# Patient Record
Sex: Male | Born: 1980 | Race: White | Hispanic: No | Marital: Married | Smoking: Never smoker
Health system: Southern US, Community
[De-identification: ages and names within clinical notes are randomized; demographics above are authoritative.]

## PROBLEM LIST (undated history)

## (undated) DIAGNOSIS — E538 Deficiency of other specified B group vitamins: Secondary | ICD-10-CM

## (undated) DIAGNOSIS — E559 Vitamin D deficiency, unspecified: Secondary | ICD-10-CM

## (undated) DIAGNOSIS — I10 Essential (primary) hypertension: Secondary | ICD-10-CM

## (undated) DIAGNOSIS — F419 Anxiety disorder, unspecified: Secondary | ICD-10-CM

## (undated) DIAGNOSIS — R7303 Prediabetes: Secondary | ICD-10-CM

## (undated) DIAGNOSIS — E785 Hyperlipidemia, unspecified: Secondary | ICD-10-CM

## (undated) HISTORY — DX: Vitamin D deficiency, unspecified: E55.9

## (undated) HISTORY — DX: Essential (primary) hypertension: I10

## (undated) HISTORY — DX: Prediabetes: R73.03

## (undated) HISTORY — DX: Hyperlipidemia, unspecified: E78.5

## (undated) HISTORY — DX: Anxiety disorder, unspecified: F41.9

## (undated) HISTORY — DX: Deficiency of other specified B group vitamins: E53.8

---

## 2014-02-02 ENCOUNTER — Encounter: Payer: Self-pay | Admitting: Physician Assistant

## 2014-02-02 ENCOUNTER — Ambulatory Visit (INDEPENDENT_AMBULATORY_CARE_PROVIDER_SITE_OTHER): Payer: BC Managed Care – PPO | Admitting: Physician Assistant

## 2014-02-02 VITALS — BP 168/98 | HR 100 | Temp 98.3°F | Resp 16 | Ht 72.0 in | Wt 256.0 lb

## 2014-02-02 DIAGNOSIS — F411 Generalized anxiety disorder: Secondary | ICD-10-CM

## 2014-02-02 DIAGNOSIS — E669 Obesity, unspecified: Secondary | ICD-10-CM | POA: Insufficient documentation

## 2014-02-02 DIAGNOSIS — E559 Vitamin D deficiency, unspecified: Secondary | ICD-10-CM

## 2014-02-02 DIAGNOSIS — Z0000 Encounter for general adult medical examination without abnormal findings: Secondary | ICD-10-CM

## 2014-02-02 DIAGNOSIS — Z79899 Other long term (current) drug therapy: Secondary | ICD-10-CM

## 2014-02-02 DIAGNOSIS — I10 Essential (primary) hypertension: Secondary | ICD-10-CM

## 2014-02-02 LAB — CBC WITH DIFFERENTIAL/PLATELET
Basophils Absolute: 0 10*3/uL (ref 0.0–0.1)
Basophils Relative: 0 % (ref 0–1)
EOS PCT: 0 % (ref 0–5)
Eosinophils Absolute: 0 10*3/uL (ref 0.0–0.7)
HCT: 45.3 % (ref 39.0–52.0)
HEMOGLOBIN: 15.8 g/dL (ref 13.0–17.0)
LYMPHS ABS: 1.4 10*3/uL (ref 0.7–4.0)
LYMPHS PCT: 26 % (ref 12–46)
MCH: 30.1 pg (ref 26.0–34.0)
MCHC: 34.9 g/dL (ref 30.0–36.0)
MCV: 86.3 fL (ref 78.0–100.0)
MONO ABS: 0.5 10*3/uL (ref 0.1–1.0)
Monocytes Relative: 10 % (ref 3–12)
Neutro Abs: 3.4 10*3/uL (ref 1.7–7.7)
Neutrophils Relative %: 64 % (ref 43–77)
PLATELETS: 248 10*3/uL (ref 150–400)
RBC: 5.25 MIL/uL (ref 4.22–5.81)
RDW: 13.5 % (ref 11.5–15.5)
WBC: 5.3 10*3/uL (ref 4.0–10.5)

## 2014-02-02 MED ORDER — ATENOLOL 100 MG PO TABS: ORAL_TABLET | ORAL | Status: DC

## 2014-02-02 NOTE — Progress Notes (Signed)
Complete Physical HPI 33 y.o. male  presents for a complete physical as a new patient.  His blood pressure has not been controlled at home, today their BP is BP: 168/98 mmHg Wife is here and states that past 6 months he has not been feeling well. She states he has come in flush, has family history of BP. The other day he had a splitting headache, took four 200mg  ibuprofen, started to work out and then felt dizzy nausea and had to stop. He laid down in the gym with his feet up for 15-20 mins, took BP at [facility] after feeling better with 200's/110. That same night had a nose bleed in the shower and heart burn in the morning.  His wife has been taking his BP it has been 150's/100's.  He eats very well, works out daily but has gained weight. He feels he gets tired after eating.  He doesn't sleep well, "runs in his sleep per wife", wakes her up. He is constantly worrying and has to do something.  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  He is able to workout for past month without CP, SOB, dizziness, Edema.  Current Medications:  No current outpatient prescriptions on file prior to visit.   No current facility-administered medications on file prior to visit.   Health Maintenance:   There is no immunization history on file for this patient.  Tetanus: 5 years ago Pneumovax: N/A Flu vaccine: N/A Zostavax: N/A DEXA: N/A Colonoscopy: N/A EGD: N/A  Allergies: No Known Allergies Medical History: History reviewed. No pertinent past medical history. Surgical History: History reviewed. No pertinent past surgical history. Family History:  Family History  Problem Relation Age of Onset  . Hypertension Father    Social History:   History  Substance Use Topics  . Smoking status: Never Smoker   . Smokeless tobacco: Never Used  . Alcohol Use: Not on file     Comment: social   ROS:  [X]  = complains of  [ ]  = denies  General: Fatigue  [ X] Fever [ ]  Chills [ ]  Weakness [ ]   Insomnia [ ]  Weight change Arly.Keller ] Night sweats [ ]   Change in appetite [ ]  Eyes: DEE 1-2 months ago Redness [ ]  Blurred vision [ ]  Diplopia [ ]  Discharge [ ]   ENT: Congestion [ ]  Sinus Pain [ ]  Post Nasal Drip [ ]  Sore Throat [ ]  Earache [ ]  hearing loss [ ]  Tinnitus [ ]  Snoring [ ]   Cardiac: Chest pain/pressure Arly.Keller ] SOB [ ]  Orthopnea [ ]   Palpitations [ ]   Paroxysmal nocturnal dyspnea[ ]  Claudication [ ]  Edema [ ]   Pulmonary: Cough [ ]  Wheezing[ ]   SOB [ ]   Pleurisy [ ]   GI: Nausea [ ]  Vomiting[ ]  Dysphagia[ ]  Heartburn[X ] Abdominal pain [ ]  Constipation [ ] ; Diarrhea [ ]  BRBPR [ ]  Melena[ ]  Bloating [ ]  Hemorrhoids [ ]   GU: Hematuria[ ]  Dysuria [ ]  Nocturia[ ]  Urgency [ ]   Hesitancy [ ]  Discharge [ ]  Frequency [ ]   Neuro: Headaches[ X] Vertigo[ ]  Paresthesias[ ]  Spasm [ ]  Speech changes [ ]  Incoordination [ ]   Ortho: Arthritis [ ]  Joint pain [ ]  Muscle pain [ ]  Joint swelling [ ]  Back Pain [ ]  Skin:  Rash [ ]   Pruritis [ ]  Change in skin lesion [ ]   Psych: Depression[ ]  Anxiety[X ] Confusion [ ]  Memory loss [ ]   Heme/Lypmh: Bleeding [ ]  Bruising [ ]  Enlarged lymph nodes [ ]   Endocrine: Visual blurring [ ]  Paresthesia [ ]  Polyuria [ ]  Polydypsea [ ]    Heat/cold intolerance [ ]  Hypoglycemia [ ]   Physical Exam: Estimated body mass index is 34.71 kg/(m^2) as calculated from the following:   Height as of this encounter: 6' (1.829 m).   Weight as of this encounter: 256 lb (116.121 kg). BP 168/98  Pulse 100  Temp(Src) 98.3 F (36.8 C)  Resp 16  Ht 6' (1.829 m)  Wt 256 lb (116.121 kg)  BMI 34.71 kg/m2 General Appearance: Well nourished, in no apparent distress. Eyes: PERRLA, EOMs, conjunctiva no swelling or erythema, normal fundi and vessels. Sinuses: No Frontal/maxillary tenderness ENT/Mouth: Ext aud canals clear, normal light reflex with TMs without erythema, bulging. Good dentition. No erythema, swelling, or exudate on post pharynx. Tonsils not  swollen or erythematous. Hearing normal.  Neck: Supple, thyroid normal. No bruits Respiratory: Respiratory effort normal, BS equal bilaterally without rales, rhonchi, wheezing or stridor. Cardio: RRR without murmurs, rubs or gallops. Brisk peripheral pulses without edema.  Chest: symmetric, with normal excursions and percussion. Abdomen: Soft, flat, +BS. Non tender, no guarding, rebound, hernias, masses, or organomegaly. .  Lymphatics: Non tender without lymphadenopathy.  Genitourinary: DEFER Musculoskeletal: Full ROM all peripheral extremities,5/5 strength, and normal gait. Skin: Warm, dry without rashes, lesions, ecchymosis.  Neuro: Cranial nerves intact, reflexes equal bilaterally. Normal muscle tone, no cerebellar symptoms. Sensation intact.  Psych: Awake and oriented X 3, normal affect, Insight and Judgment appropriate.   EKG: WNL, no ST changes  Assessment and Plan: Fatigue-  Check labs HTN- get CXR, EKG, mircoalbumin, evaluate for end organ damage, Atenolol 100mg  start 1/2 pill daily and can go up to one, check labs Anxiety- add BB, will reevaluate in 1 week Health Maintenance   Discussed med's effects and SE's. Screening labs and tests as requested with regular follow-up as recommended.  Quentin Mullingollier, Tiawana Forgy 3:52 PM

## 2014-02-02 NOTE — Patient Instructions (Signed)
Generalized Anxiety Disorder Generalized anxiety disorder (GAD) is a mental disorder. It interferes with life functions, including relationships, work, and school. GAD is different from normal anxiety, which everyone experiences at some point in their lives in response to specific life events and activities. Normal anxiety actually helps [place] prepare for and get through these life events and activities. Normal anxiety goes away after the event or activity is over.  GAD causes anxiety that is not necessarily related to specific events or activities. It also causes excess anxiety in proportion to specific events or activities. The anxiety associated with GAD is also difficult to control. GAD can vary from mild to severe. People with severe GAD can have intense waves of anxiety with physical symptoms (panic attacks).  SYMPTOMS The anxiety and worry associated with GAD are difficult to control. This anxiety and worry are related to many life events and activities and also occur more days than not for 6 months or longer. People with GAD also have three or more of the following symptoms (one or more in children):  Restlessness.   Fatigue.  Difficulty concentrating.   Irritability.  Muscle tension.  Difficulty sleeping or unsatisfying sleep. DIAGNOSIS GAD is diagnosed through an assessment by your caregiver. Your caregiver will ask you questions aboutyour mood,physical symptoms, and events in your life. Your caregiver may ask you about your medical history and use of alcohol or drugs, including prescription medications. Your caregiver may also do a physical exam and blood tests. Certain medical conditions and the use of certain substances can cause symptoms similar to those associated with GAD. Your caregiver may refer you to a mental health specialist for further evaluation. TREATMENT The following therapies are usually used to treat GAD:   Medication Antidepressant medication usually is  prescribed for long-term daily control. Antianxiety medications may be added in severe cases, especially when panic attacks occur.   Talk therapy (psychotherapy) Certain types of talk therapy can be helpful in treating GAD by providing support, education, and guidance. A form of talk therapy called cognitive behavioral therapy can teach you healthy ways to think about and react to daily life events and activities.  Stress managementtechniques These include yoga, meditation, and exercise and can be very helpful when they are practiced regularly. A mental health specialist can help determine which treatment is best for you. Some people see improvement with one therapy. However, other people require a combination of therapies. Document Released: 03/07/2013 Document Reviewed: 03/07/2013 [facility] Patient Information 2014 [place], [place]. Hypertension As your heart beats, it forces blood through your arteries. This force is your blood pressure. If the pressure is too high, it is called hypertension (HTN) or high blood pressure. HTN is dangerous because you may have it and not know it. High blood pressure may mean that your heart has to work harder to pump blood. Your arteries may be narrow or stiff. The extra work puts you at risk for heart disease, stroke, and other problems.  Blood pressure consists of two numbers, a higher number over a lower, 110/72, for example. It is stated as "110 over 72." The ideal is below 120 for the top number (systolic) and under 80 for the bottom (diastolic). Write down your blood pressure today. You should pay close attention to your blood pressure if you have certain conditions such as:  Heart failure.  Prior heart attack.  Diabetes  Chronic kidney disease.  Prior stroke.  Multiple risk factors for heart disease. To see if you have HTN, your  blood pressure should be measured while you are seated with your arm held at the level of the heart. It should be measured  at least twice. A one-time elevated blood pressure reading (especially in the Emergency Department) does not mean that you need treatment. There may be conditions in which the blood pressure is different between your right and left arms. It is important to see your caregiver soon for a recheck. Most people have essential hypertension which means that there is not a specific cause. This type of high blood pressure may be lowered by changing lifestyle factors such as:  Stress.  Smoking.  Lack of exercise.  Excessive weight.  Drug/tobacco/alcohol use.  Eating less salt. Most people do not have symptoms from high blood pressure until it has caused damage to the body. Effective treatment can often prevent, delay or reduce that damage. TREATMENT  When a cause has been identified, treatment for high blood pressure is directed at the cause. There are a large number of medications to treat HTN. These fall into several categories, and your caregiver will help you select the medicines that are best for you. Medications may have side effects. You should review side effects with your caregiver. If your blood pressure stays high after you have made lifestyle changes or started on medicines,   Your medication(s) may need to be changed.  Other problems may need to be addressed.  Be certain you understand your prescriptions, and know how and when to take your medicine.  Be sure to follow up with your caregiver within the time frame advised (usually within two weeks) to have your blood pressure rechecked and to review your medications.  If you are taking more than one medicine to lower your blood pressure, make sure you know how and at what times they should be taken. Taking two medicines at the same time can result in blood pressure that is too low. SEEK IMMEDIATE MEDICAL CARE IF:  You develop a severe headache, blurred or changing vision, or confusion.  You have unusual weakness or numbness, or a  faint feeling.  You have severe chest or abdominal pain, vomiting, or breathing problems. MAKE SURE YOU:   Understand these instructions.  Will watch your condition.  Will get help right away if you are not doing well or get worse. Document Released: 11/10/2005 Document Revised: 02/02/2012 Document Reviewed: 06/30/2008 [facility] Patient Information 2014 [place].

## 2014-02-03 ENCOUNTER — Telehealth: Payer: Self-pay

## 2014-02-03 LAB — HEPATIC FUNCTION PANEL
ALBUMIN: 4.9 g/dL (ref 3.5–5.2)
ALK PHOS: 63 U/L (ref 39–117)
ALT: 46 U/L (ref 0–53)
AST: 30 U/L (ref 0–37)
Bilirubin, Direct: 0.1 mg/dL (ref 0.0–0.3)
Indirect Bilirubin: 0.5 mg/dL (ref 0.2–1.2)
Total Bilirubin: 0.6 mg/dL (ref 0.2–1.2)
Total Protein: 7.4 g/dL (ref 6.0–8.3)

## 2014-02-03 LAB — LIPID PANEL
CHOL/HDL RATIO: 4.4 ratio
Cholesterol: 209 mg/dL — ABNORMAL HIGH (ref 0–200)
HDL: 48 mg/dL (ref >39)
LDL CALC: 141 mg/dL — AB (ref 0–99)
Triglycerides: 99 mg/dL (ref <150)
VLDL: 20 mg/dL (ref 0–40)

## 2014-02-03 LAB — URINALYSIS, ROUTINE W REFLEX MICROSCOPIC
Bilirubin Urine: NEGATIVE
GLUCOSE, UA: NEGATIVE mg/dL
HGB URINE DIPSTICK: NEGATIVE
Ketones, ur: NEGATIVE mg/dL
LEUKOCYTES UA: NEGATIVE
Nitrite: NEGATIVE
PROTEIN: NEGATIVE mg/dL
Specific Gravity, Urine: 1.015 (ref 1.005–1.030)
Urobilinogen, UA: 0.2 mg/dL (ref 0.0–1.0)
pH: 6.5 (ref 5.0–8.0)

## 2014-02-03 LAB — IRON AND TIBC
%SAT: 24 % (ref 20–55)
IRON: 83 ug/dL (ref 42–165)
TIBC: 353 ug/dL (ref 215–435)
UIBC: 270 ug/dL (ref 125–400)

## 2014-02-03 LAB — BASIC METABOLIC PANEL WITH GFR
BUN: 18 mg/dL (ref 6–23)
CHLORIDE: 102 meq/L (ref 96–112)
CO2: 28 meq/L (ref 19–32)
Calcium: 9.6 mg/dL (ref 8.4–10.5)
Creat: 0.94 mg/dL (ref 0.50–1.35)
GFR, Est African American: >89 mL/min
GFR, Est Non African American: >89 mL/min
Glucose, Bld: 108 mg/dL — ABNORMAL HIGH (ref 70–99)
Potassium: 4.3 mEq/L (ref 3.5–5.3)
Sodium: 139 mEq/L (ref 135–145)

## 2014-02-03 LAB — VITAMIN B12: VITAMIN B 12: 302 pg/mL (ref 211–911)

## 2014-02-03 LAB — MAGNESIUM: Magnesium: 1.9 mg/dL (ref 1.5–2.5)

## 2014-02-03 LAB — FERRITIN: Ferritin: 142 ng/mL (ref 22–322)

## 2014-02-03 LAB — TSH: TSH: 1.86 u[IU]/mL (ref 0.350–4.500)

## 2014-02-03 LAB — HEMOGLOBIN A1C
HEMOGLOBIN A1C: 5.6 % (ref <5.7)
Mean Plasma Glucose: 114 mg/dL (ref <117)

## 2014-02-03 LAB — INSULIN, FASTING: Insulin fasting, serum: 30 u[IU]/mL — ABNORMAL HIGH (ref 3–28)

## 2014-02-03 LAB — TESTOSTERONE: Testosterone: 485 ng/dL (ref 300–890)

## 2014-02-03 LAB — MICROALBUMIN / CREATININE URINE RATIO
Creatinine, Urine: 78.8 mg/dL
Microalb Creat Ratio: 6.3 mg/g (ref 0.0–30.0)
Microalb, Ur: 0.5 mg/dL (ref 0.00–1.89)

## 2014-02-03 LAB — VITAMIN D 25 HYDROXY (VIT D DEFICIENCY, FRACTURES): VIT D 25 HYDROXY: 52 ng/mL (ref 30–89)

## 2014-02-03 NOTE — Telephone Encounter (Signed)
Message copied by Joya MartyrZMENT, Merrell Borsuk M on Fri Feb 03, 2014 12:01 PM ------      Message from: Quentin MullingOLLIER, AMANDA R      Created: Fri Feb 03, 2014  8:13 AM       All of your labs are normal except:      Your LDL is not in range. Your LDL is the bad cholesterol that can lead to heart attack and stroke. To lower your number you can decrease your fatty foods, red meat, cheese, milk and increase fiber like whole grains and veggies. You can also add a fiber supplement like Metamucil or Benefiber.             Your A1C is normal but your insulin is high. This can make it hard to lose weight, crave carbs but when you eat carbs you crash.  Please make sure you decrease bad carbs like white bread, white rice, potatoes, corn, soft drinks, pasta, cereals, refined sugars, sweet tea, dried fruits, and fruit juice. Good carbs are okay to eat in moderation like sweet potatoes, brown rice, whole grain pasta/bread, most fruit (expect dried fruit) and you can eat as many veggies as you want. Also sometimes we will put patients on metformin for elevated insulin. We can discuss at next visit but here is some information about metformin. We start Metformin to prevent or treat diabetes. Metformin does not cause low blood sugars. In order to create energy your cells need insulin and sugar but sometime your cells do not accept the insulin and this can cause increased sugars and decreased energy. The Metformin helps your cells accept insulin and the sugar to give you more energy.             B12 is low end of normal, add sublingual B12. The sublingual is better than the pills because likely you are not absorbing in your intestines well so the sublingual gets absorbed through your mouth and ensures you get the amount you need. Will help with energy, memory/concentration, decrease nerve pain, and help with weight loss. B12 is water soluble vitamin so you can not over dose on it, and anything you do not use will be sent out in your urine.               Testosterone is normal.             TSH is normal.             Get CXR and follow up in one week. ------

## 2014-02-03 NOTE — Telephone Encounter (Signed)
lmom to pt about lab results. 

## 2014-02-07 ENCOUNTER — Ambulatory Visit (HOSPITAL_COMMUNITY)
Admission: RE | Admit: 2014-02-07 | Discharge: 2014-02-07 | Disposition: A | Discharge status: Home / Self Care | Payer: BC Managed Care – PPO | Source: Ambulatory Visit | Attending: Physician Assistant | Admitting: Physician Assistant

## 2014-02-07 DIAGNOSIS — Z0000 Encounter for general adult medical examination without abnormal findings: Secondary | ICD-10-CM

## 2014-02-07 DIAGNOSIS — I10 Essential (primary) hypertension: Secondary | ICD-10-CM | POA: Insufficient documentation

## 2014-02-09 ENCOUNTER — Encounter: Payer: Self-pay | Admitting: Internal Medicine

## 2014-02-09 ENCOUNTER — Ambulatory Visit: Payer: Self-pay | Admitting: Physician Assistant

## 2014-02-14 ENCOUNTER — Ambulatory Visit (INDEPENDENT_AMBULATORY_CARE_PROVIDER_SITE_OTHER): Payer: BC Managed Care – PPO | Admitting: Physician Assistant

## 2014-02-14 ENCOUNTER — Encounter: Payer: Self-pay | Admitting: Physician Assistant

## 2014-02-14 VITALS — BP 136/82 | HR 64 | Temp 98.4°F | Resp 16 | Ht 72.0 in | Wt 251.0 lb

## 2014-02-14 DIAGNOSIS — I10 Essential (primary) hypertension: Secondary | ICD-10-CM

## 2014-02-14 DIAGNOSIS — R51 Headache: Secondary | ICD-10-CM

## 2014-02-14 DIAGNOSIS — E669 Obesity, unspecified: Secondary | ICD-10-CM

## 2014-02-14 DIAGNOSIS — F411 Generalized anxiety disorder: Secondary | ICD-10-CM

## 2014-02-14 MED ORDER — CITALOPRAM HYDROBROMIDE 20 MG PO TABS: 20.0000 mg | ORAL_TABLET | Freq: Every day | ORAL | Status: DC

## 2014-02-14 NOTE — Patient Instructions (Addendum)
Add Benefiber 1-2 TBSP in the morning to lower cholesterol.   Try citalopram 20mg  take 1/2 pill for 2 weeks and then go up to 1 pill for 4 weeks.   Social Anxiety Disorder Social anxiety disorder, previously called social phobia, is a mental disorder. People with social anxiety disorder frequently feel nervous, afraid, or embarrassed when around other people in social situations. They constantly worry that other people are judging or criticizing them for how they look, what they say, or how they act. They may worry that other people might reject them because of their appearance or behavior. Social anxiety disorder is more than just occasional shyness or self-consciousness. It can cause severe emotional distress. It can interfere with daily life activities. Social anxiety disorder also may lead to excessive alcohol or drug use and even suicide.  Social anxiety disorder is actually one of the most common mental disorders. It can develop at any time but usually starts in the teenage years. Women are more commonly affected than men. Social anxiety disorder is also more common in people who have family members with anxiety disorders. It also is more common in people who have physical deformities or conditions with characteristics that are obvious to others, such as stuttered speech or movement abnormalities (Parkinson disease).  SYMPTOMS  In addition to feeling anxious or fearful in social situations, people with social anxiety disorder frequently have physical symptoms. Examples include:  Red face (blushing).  Racing heart.  Sweating.  Shaky hands or voice.  Confusion.  Lightheadedness.  Upset stomach and diarrhea. DIAGNOSIS  Social anxiety disorder is diagnosed through an assessment by your caregiver. Your caregiver will ask you questions about your mood, thoughts, and reactions in social situations. Your caregiver may ask you about your medical history and use of alcohol or drugs, including  prescription medications. Certain medical conditions and the use of certain substances, including caffeine, can cause symptoms similar to social anxiety disorder. Your caregiver may refer you to a mental health specialist for further evaluation or treatment. The criteria for diagnosis of social anxiety disorder are:  Marked fear or anxiety in one or more social situations in which you may be closely watched or studied by others. Examples of such situations include:  Interacting socially (having a conversation with others, going to a party, or meeting strangers).  Being observed (eating or drinking in public or being called on in class).  Performing in front of others (giving a speech).  The social situations of concern almost always cause fear or anxiety, not just occasionally.  People with social anxiety disorder fear that they will be viewed negatively in a way that will be embarrassing, will lead to rejection, or will offend others. This fear is out of proportion to the actual threat posed by the social situation.  Often the triggering social situations are avoided, or they are endured with intense fear or anxiety. The fear, anxiety, or avoidance is persistent and lasts for 6 months or longer.  The anxiety causes difficulty functioning in at least some parts of your daily life. TREATMENT  Several types of treatment are available for social anxiety disorder. These treatments are often used in combination and include:   Talk therapy. Group talk therapy allows you to see that you are not alone with these problems. Individual talk therapy helps you address your specific anxiety issues with a caring professional. The most effective forms of talk therapy for social anxiety disorder are cognitive behavioral therapy and exposure therapy. Cognitive behavioral therapy  helps you to identify and change negative thoughts and beliefs that are at the root of the disorder. Exposure therapy allows you to  gradually face the situations that you fear most.  Relaxation and coping techniques. These include deep breathing, self-talk, meditation, visual imagery, and yoga. Relaxation techniques help to keep you calm in social situations.  Social Optician, dispensing.Social skills can be learned on your own or with the help of a talk therapist. They can help you feel more confident and comfortable in social situations.  Medication. For anxiety limited to performance situations (performance anxiety), medication called beta blockers can help by reducing or preventing the physical symptoms of social anxiety disorder. For more persistent and generalized social anxiety, antidepressant medication may be prescribed to help control symptoms. In severe cases of social anxiety disorder, strong antianxiety medication, called benzodiazepines, may be prescribed on a limited basis and for a short time. Document Released: 10/09/2005 Document Revised: 03/07/2013 Document Reviewed: 02/08/2013 [facility] Patient Information 2014 [place], [place].

## 2014-02-14 NOTE — Progress Notes (Signed)
HPI 33 y.o. male  presents for 2-3 week follow up for HTN, anxiety.   He was started on  today their BP is BP: 136/82 mmHg He does workout. He denies chest pain, shortness of breath, dizziness.  He is not on cholesterol medication and denies myalgias. His cholesterol is not at goal. The cholesterol last visit was:   Lab Results  Component Value Date   CHOL 209* 02/02/2014   HDL 48 02/02/2014   LDLCALC 141* 02/02/2014   TRIG 99 02/02/2014   CHOLHDL 4.4 02/02/2014   Last A1C in the office was:  Lab Results  Component Value Date   HGBA1C 5.6 02/02/2014   Patient is on Vitamin D supplement.     Current Medications:  Current Outpatient Prescriptions on File Prior to Visit  Medication Sig Dispense Refill  . atenolol (TENORMIN) 100 MG tablet 1/2 -1 pill nightly for blood pressure  90 tablet  1  . ibuprofen (ADVIL,MOTRIN) 400 MG tablet Take 400 mg by mouth every 6 (six) hours as needed.      . multivitamin (ONE-A-DAY MEN'S) TABS tablet Take 1 tablet by mouth daily.      . Omega-3 Fatty Acids (FISH OIL PO) Take by mouth daily.       No current facility-administered medications on file prior to visit.   Medical History: No past medical history on file. Allergies: No Known Allergies   Review of Systems: [X]  = complains of  [ ]  = denies  General: Fatigue [ ]  Fever [ ]  Chills [ ]  Weakness [ ]   Insomnia [ ]  Eyes: Redness [ ]  Blurred vision [ ]  Diplopia [ ]   ENT: Congestion [ ]  Sinus Pain [ ]  Post Nasal Drip [ ]  Sore Throat [ ]  Earache [ ]   Cardiac: Chest pain/pressure [ ]  SOB [ ]  Orthopnea [ ]   Palpitations [ ]   Paroxysmal nocturnal dyspnea[ ]  Claudication [ ]  Edema [ ]   Pulmonary: Cough [ ]  Wheezing[ ]   SOB [ ]   Snoring [ ]   GI: Nausea [ ]  Vomiting[ ]  Dysphagia[ ]  Heartburn[ ]  Abdominal pain [ ]  Constipation [ ] ; Diarrhea [ ] ; BRBPR [ ]  Melena[ ]  GU: Hematuria[ ]  Dysuria [ ]  Nocturia[ ]  Urgency [ ]   Hesitancy [ ]  Discharge [ ]  Neuro: Headaches[ ]  Vertigo[ ]  Paresthesias[ ]  Spasm [ ]  Speech  changes [ ]  Incoordination [ ]   Ortho: Arthritis [ ]  Joint pain [ ]  Muscle pain [ ]  Joint swelling [ ]  Back Pain [ ]  Skin:  Rash [ ]   Pruritis [ ]  Change in skin lesion [ ]   Psych: Depression[ ]  Anxiety[ ]  Confusion [ ]  Memory loss [ ]   Heme/Lypmh: Bleeding [ ]  Bruising [ ]  Enlarged lymph nodes [ ]   Endocrine: Visual blurring [ ]  Paresthesia [ ]  Polyuria [ ]  Polydypsea [ ]    Heat/cold intolerance [ ]  Hypoglycemia [ ]   Family history- Review and unchanged Social history- Review and unchanged Physical Exam: BP 136/82  Pulse 64  Temp(Src) 98.4 F (36.9 C)  Resp 16  Ht 6' (1.829 m)  Wt 251 lb (113.853 kg)  BMI 34.03 kg/m2 Wt Readings from Last 3 Encounters:  02/14/14 251 lb (113.853 kg)  02/02/14 256 lb (116.121 kg)   General Appearance: Well nourished, in no apparent distress. Eyes: PERRLA, EOMs, conjunctiva no swelling or erythema Sinuses: No Frontal/maxillary tenderness ENT/Mouth: Ext aud canals clear, TMs without erythema, bulging. No erythema, swelling, or exudate on post pharynx.  Tonsils not swollen or erythematous. Hearing  normal.  Neck: Supple, thyroid normal.  Respiratory: Respiratory effort normal, BS equal bilaterally without rales, rhonchi, wheezing or stridor.  Cardio: RRR with no MRGs. Brisk peripheral pulses without edema.  Abdomen: Soft, + BS.  Non tender, no guarding, rebound, hernias, masses. Lymphatics: Non tender without lymphadenopathy.  Musculoskeletal: Full ROM, 5/5 strength, normal gait.  Skin: Warm, dry without rashes, lesions, ecchymosis.  Neuro: Cranial nerves intact. Normal muscle tone, no cerebellar symptoms. Sensation intact.  Psych: Awake and oriented X 3, normal affect, Insight and Judgment appropriate.   Assessment and Plan:  Hypertension: added atenolol, continue to have headaches- check cortisol Cholesterol: Get on benefiber  Insulin resistance- decrease carbs Headache- patient does not want a work up at this time, no neurological  deficits/symptoms- will monitor Anxiety- try citalopram 20  Continue diet and meds as discussed. Further disposition pending results of labs.  Quentin Mulling 3:50 PM

## 2014-03-22 ENCOUNTER — Telehealth: Payer: Self-pay | Admitting: *Deleted

## 2014-03-22 ENCOUNTER — Encounter: Payer: Self-pay | Admitting: Physician Assistant

## 2014-03-22 MED ORDER — SERTRALINE HCL 100 MG PO TABS: 100.0000 mg | ORAL_TABLET | Freq: Every day | ORAL | Status: DC

## 2014-03-22 NOTE — Telephone Encounter (Signed)
Victorino DikeJennifer pts wife is calling asking if you thought switching pt to zoloft would be more effective for him? He is due to refill the celexa but wants to know what you thought before doing the refill.  Please call pts wife at    [phone]

## 2014-03-23 ENCOUNTER — Ambulatory Visit: Payer: Self-pay | Admitting: Physician Assistant

## 2014-04-06 ENCOUNTER — Encounter: Payer: Self-pay | Admitting: Physician Assistant

## 2014-04-06 ENCOUNTER — Ambulatory Visit (INDEPENDENT_AMBULATORY_CARE_PROVIDER_SITE_OTHER): Payer: BC Managed Care – PPO | Admitting: Physician Assistant

## 2014-04-06 VITALS — BP 132/84 | HR 68 | Temp 98.1°F | Resp 16 | Wt 248.0 lb

## 2014-04-06 DIAGNOSIS — E669 Obesity, unspecified: Secondary | ICD-10-CM

## 2014-04-06 DIAGNOSIS — I10 Essential (primary) hypertension: Secondary | ICD-10-CM

## 2014-04-06 DIAGNOSIS — R7309 Other abnormal glucose: Secondary | ICD-10-CM

## 2014-04-06 DIAGNOSIS — F411 Generalized anxiety disorder: Secondary | ICD-10-CM

## 2014-04-06 DIAGNOSIS — R7303 Prediabetes: Secondary | ICD-10-CM

## 2014-04-06 MED ORDER — TERBINAFINE HCL 250 MG PO TABS: 250.0000 mg | ORAL_TABLET | Freq: Every day | ORAL | Status: DC

## 2014-04-06 NOTE — Progress Notes (Signed)
HPI 33 y.o.male presents for follow up for HTN, cholesterol, B12 def and anxiety/anger issues. He is starting to follow up with a counselor and she suggesting trying zoloft rather than celexa.   Past Medical History  Diagnosis Date  . Hypertension   . Hyperlipidemia   . Prediabetes   . Anxiety   . B12 deficiency   . Vitamin D deficiency      No Known Allergies    Current Outpatient Prescriptions on File Prior to Visit  Medication Sig Dispense Refill  . atenolol (TENORMIN) 100 MG tablet 1/2 -1 pill nightly for blood pressure  90 tablet  1  . ibuprofen (ADVIL,MOTRIN) 400 MG tablet Take 400 mg by mouth every 6 (six) hours as needed.      . multivitamin (ONE-A-DAY MEN'S) TABS tablet Take 1 tablet by mouth daily.      . Omega-3 Fatty Acids (FISH OIL PO) Take by mouth daily.      . sertraline (ZOLOFT) 100 MG tablet Take 1 tablet (100 mg total) by mouth daily.  30 tablet  2   No current facility-administered medications on file prior to visit.    ROS: all negative expect above.   Physical: Filed Weights   04/06/14 1626  Weight: 248 lb (112.492 kg)   BP 132/84  Pulse 68  Temp(Src) 98.1 F (36.7 C)  Resp 16  Wt 248 lb (112.492 kg) General Appearance: Well nourished, in no apparent distress. Eyes: PERRLA, EOMs. Sinuses: No Frontal/maxillary tenderness ENT/Mouth: Ext aud canals clear, normal light reflex with TMs without erythema, bulging. Post pharynx without erythema, swelling, exudate.  Respiratory: CTAB Cardio: RRR, no murmurs, rubs or gallops. Peripheral pulses brisk and equal bilaterally, without edema. No aortic or femoral bruits. Abdomen: Soft, with bowl sounds. Nontender, no guarding, rebound. Lymphatics: Non tender without lymphadenopathy.  Musculoskeletal: Full ROM all peripheral extremities, 5/5 strength, and normal gait. Skin: Warm, dry without rashes, lesions, ecchymosis.  Neuro: Cranial nerves intact, reflexes equal bilaterally. Normal muscle tone, no cerebellar  symptoms. Sensation intact.  Pysch: Awake and oriented X 3, normal affect, Insight and Judgment appropriate.   Assessment and Plan: HTN- controlled on atenolol 1/2 daily Chol- diet controlled Anxiety- continue zoloft for now may switch to celexa or lexapro B12- cont med Nail fungus- lamisil 250  Follow up 6 months for LFTs, B12, Chol

## 2014-04-10 ENCOUNTER — Telehealth: Payer: Self-pay | Admitting: *Deleted

## 2014-04-10 NOTE — Telephone Encounter (Signed)
Pt

## 2014-06-05 ENCOUNTER — Other Ambulatory Visit: Payer: Self-pay | Admitting: Physician Assistant

## 2014-06-15 ENCOUNTER — Ambulatory Visit: Payer: Self-pay | Admitting: Physician Assistant

## 2014-06-20 ENCOUNTER — Encounter: Payer: Self-pay | Admitting: Internal Medicine

## 2014-10-25 ENCOUNTER — Telehealth: Payer: Self-pay | Admitting: Internal Medicine

## 2014-10-25 MED ORDER — CITALOPRAM HYDROBROMIDE 20 MG PO TABS: 20.0000 mg | ORAL_TABLET | Freq: Every day | ORAL | Status: DC

## 2014-10-25 NOTE — Telephone Encounter (Signed)
RENEWAL REQUEST ON   citalopram (CELEXA) 20 MG tablet [[id number]][[id number]]   Thank you, Katrina Webb SilversmithWelch [facility] [phone] Fax [phone]

## 2014-10-26 ENCOUNTER — Ambulatory Visit: Payer: Self-pay | Admitting: Physician Assistant

## 2014-10-26 ENCOUNTER — Other Ambulatory Visit: Payer: Self-pay

## 2014-10-26 MED ORDER — SERTRALINE HCL 100 MG PO TABS: 100.0000 mg | ORAL_TABLET | Freq: Every day | ORAL | Status: DC

## 2014-11-08 ENCOUNTER — Ambulatory Visit (INDEPENDENT_AMBULATORY_CARE_PROVIDER_SITE_OTHER): Payer: BC Managed Care – PPO | Admitting: Physician Assistant

## 2014-11-08 ENCOUNTER — Encounter: Payer: Self-pay | Admitting: Physician Assistant

## 2014-11-08 VITALS — BP 130/80 | HR 74 | Temp 98.4°F | Resp 18 | Ht 72.0 in | Wt 256.0 lb

## 2014-11-08 DIAGNOSIS — R7309 Other abnormal glucose: Secondary | ICD-10-CM

## 2014-11-08 DIAGNOSIS — E669 Obesity, unspecified: Secondary | ICD-10-CM

## 2014-11-08 DIAGNOSIS — E785 Hyperlipidemia, unspecified: Secondary | ICD-10-CM | POA: Insufficient documentation

## 2014-11-08 DIAGNOSIS — Z79899 Other long term (current) drug therapy: Secondary | ICD-10-CM

## 2014-11-08 DIAGNOSIS — R7303 Prediabetes: Secondary | ICD-10-CM | POA: Insufficient documentation

## 2014-11-08 DIAGNOSIS — R635 Abnormal weight gain: Secondary | ICD-10-CM

## 2014-11-08 DIAGNOSIS — I10 Essential (primary) hypertension: Secondary | ICD-10-CM | POA: Insufficient documentation

## 2014-11-08 DIAGNOSIS — E559 Vitamin D deficiency, unspecified: Secondary | ICD-10-CM

## 2014-11-08 DIAGNOSIS — F411 Generalized anxiety disorder: Secondary | ICD-10-CM

## 2014-11-08 LAB — CBC WITH DIFFERENTIAL/PLATELET
BASOS ABS: 0 10*3/uL (ref 0.0–0.1)
BASOS PCT: 0 % (ref 0–1)
EOS ABS: 0.1 10*3/uL (ref 0.0–0.7)
Eosinophils Relative: 1 % (ref 0–5)
HCT: 43.1 % (ref 39.0–52.0)
Hemoglobin: 15.1 g/dL (ref 13.0–17.0)
Lymphocytes Relative: 29 % (ref 12–46)
Lymphs Abs: 1.5 10*3/uL (ref 0.7–4.0)
MCH: 30.3 pg (ref 26.0–34.0)
MCHC: 35 g/dL (ref 30.0–36.0)
MCV: 86.5 fL (ref 78.0–100.0)
MONOS PCT: 10 % (ref 3–12)
MPV: 10.4 fL (ref 9.4–12.4)
Monocytes Absolute: 0.5 10*3/uL (ref 0.1–1.0)
NEUTROS PCT: 60 % (ref 43–77)
Neutro Abs: 3.2 10*3/uL (ref 1.7–7.7)
PLATELETS: 242 10*3/uL (ref 150–400)
RBC: 4.98 MIL/uL (ref 4.22–5.81)
RDW: 13.2 % (ref 11.5–15.5)
WBC: 5.3 10*3/uL (ref 4.0–10.5)

## 2014-11-08 NOTE — Progress Notes (Signed)
Assessment and Plan:  1. Essential hypertension Continue Atenolol as prescribed.  Monitor blood pressure at home.  Reminder to go to the ER if any CP, SOB, nausea, dizziness, severe HA, changes vision/speech, left arm numbness and tingling, and jaw pain. - CBC with Differential - BASIC METABOLIC PANEL WITH GFR - Hepatic function panel  2. Hyperlipidemia Please remember to take Fish Oil 1200mg  (2 capsules daily).  Check Cholesterol.  Please follow recommended diet and exercise. - Lipid panel  3. Prediabetes Please follow recommended diet and exercise.  Check A1C and insulin levels. - Hemoglobin A1c - Insulin, fasting  4. Obesity (BMI 30-39.9) Please follow recommended diet and exercise.  Cut back on alcohol intact.  Please stop doing workout energy drinks.  5. Generalized anxiety disorder Continue Celexa as prescribed.  6. Weight gain Ordered lab to check for thyroid abnormality. - TSH  7. Vitamin D deficiency Start taking Vitamin D 5,000 IU daily.  Check vitamin D level. - Vit D  25 hydroxy  8. Encounter for long-term (current) use of medications Will monitor kidney and liver function. - CBC with Differential - BASIC METABOLIC PANEL WITH GFR - Hepatic function panel   Continue diet and meds as discussed. Further disposition pending results of labs. Discussed medication effects and SE's.  Pt agreed to treatment plan. Please follow up in 3 months for physical with Dr. Oneta RackMcKeown.   HPI A Caucasian 33 y.o. male presents for 3 month follow up with hypertension, hyperlipidemia, prediabetes, and anxiety.  Patient is also concerned about weight gain.  Last visit was 04/06/14.  His blood pressure has been controlled at home, today their BP is BP: 130/80 mmHg.  He takes Atenolol 100mg - 1/2 at night.  He does workout, last month been doing cardio more often- hour a day on treadmill. He denies chest pain, shortness of breath, dizziness.   He is on cholesterol medication (Fish Oil and  is currently not taking) and denies myalgias. His cholesterol is not at goal. The cholesterol last visit was:   Lab Results  Component Value Date   CHOL 209* 02/02/2014   HDL 48 02/02/2014   LDLCALC 141* 02/02/2014   TRIG 99 02/02/2014   CHOLHDL 4.4 02/02/2014   He has been working on diet and exercise for prediabetes, and denies increased appetite, polydipsia and polyuria. Last A1C in the office was:  Lab Results  Component Value Date   HGBA1C 5.6 02/02/2014  Number of meals per day? 3-4  B- eggs  L- chicken and vegetables, corn  D- chicken and vegetables and corn  Snacks? None   Beverages? Water, no soda, pre work out energy drink, 1 energy drink a day no cal.  Coffee- 1 cup per day Alcohol- Has been doing 12 pack a week, but usually drinks couple beers on Fridays and Saturdays.    Patient is on Vitamin D supplement.  - Multivitamin daily Lab Results  Component Value Date   VD25OH 52 02/02/2014     Anxiety- Celexa 20mg - Patient states he went off Celexa for one week and got very dizzy.  Patient states he is currently taking it every day.  Patient states he was taking Zoloft, but stopped taking due to not noticing a difference.      Current Medications:  Current Outpatient Prescriptions on File Prior to Visit  Medication Sig Dispense Refill  . atenolol (TENORMIN) 100 MG tablet 1/2 -1 pill nightly for blood pressure 90 tablet 1  . citalopram (CELEXA) 20 MG tablet Take  1 tablet (20 mg total) by mouth daily. 30 tablet 2   No current facility-administered medications on file prior to visit.   Medical History:  Past Medical History  Diagnosis Date  . Hypertension   . Hyperlipidemia   . Prediabetes   . Anxiety   . B12 deficiency   . Vitamin D deficiency    Allergies: No Known Allergies   ROS- Review of Systems  Constitutional: Negative.   HENT: Negative.   Eyes: Negative.   Respiratory: Negative.   Cardiovascular: Negative.   Gastrointestinal: Negative.    Genitourinary: Negative.   Musculoskeletal: Negative.   Skin: Negative.  Negative for rash.  Neurological: Negative.   Psychiatric/Behavioral: The patient is nervous/anxious.        Stress with job.   Family history- Review and unchanged Social history- Review and unchanged  Physical Exam: BP 130/80 mmHg  Pulse 74  Temp(Src) 98.4 F (36.9 C) (Temporal)  Resp 18  Ht 6' (1.829 m)  Wt 256 lb (116.121 kg)  BMI 34.71 kg/m2  SpO2 98% Wt Readings from Last 3 Encounters:  11/08/14 256 lb (116.121 kg)  04/06/14 248 lb (112.492 kg)  02/14/14 251 lb (113.853 kg)  Vitals Reviewed. General Appearance: Well nourished, in no apparent distress. Obese. Eyes: PERRLA, EOMs, conjunctiva no swelling or erythema.  No scleral icterus. Sinuses: No Frontal/maxillary tenderness ENT/Mouth: Ext aud canals clear, TMs without erythema, edema or bulging. No erythema, swelling, or exudate on posterior pharynx.  Tonsils not swollen or erythematous. Hearing normal.  Neck: Supple, thyroid normal.  Respiratory: Respiratory effort normal, CTAB.  No w/r/r or stridor.  Cardio: RRR with no M/R/Gs. Brisk peripheral pulses without edema.  Abdomen: Soft, + normal BS.  Non tender, no guarding or rebound. Lymphatics: Non tender without lymphadenopathy.  Musculoskeletal: Full ROM, 5/5 strength, normal gait.  Skin: Warm, dry intact without rashes, lesions, ecchymosis.  Neuro: Cranial nerves intact. Normal muscle tone, no cerebellar symptoms. Sensation intact.  Psych: Awake and oriented X 3, normal affect, Insight and Judgment appropriate.     Juanya Villavicencio, Lise AuerJennifer L, PA-C 3:31 PM [facility]

## 2014-11-08 NOTE — Patient Instructions (Addendum)
-  Continue Atenolol and Celexa as prescribed. -Please start taking Fish Oil again- 1,000mg  (2 capsules per day). -Vitamin D 5,000 IU daily. Continue Multivitamin  Please follow up in 3 months for physical with Dr. Oneta RackMcKeown.     GIVE PT FOOD CHOICE LISTS FOR MEAL PLANNING  1)The amount of food you eat is important  -Too much can increase glucose levels and cause you to gain weight (which can  also increase glucose levels.  -Too little can decrease glucose level to unsafe levels (<70) 2)Eat meals and snacks at the same time each day to help your diabetes medication help you.  If you eat at different times each day, then the medication will not be as effective. 3)Do NOT skip meals or eat meals later than usual.  If you skip meals, then your glucose level can go low (<70).  Eating meals later than usual will not help the medication work effectively. 4) Amount of carbohydrates (carbs) per meal  -Breakfast- 30-45 grams of carbs  -Lunch and Dinner- 45-60 grams of carbs  -Snacks- 15-30 grams of carbs 5)Low fat foods have no more than 3 grams of fat per serving.  -Saturated- look for less than 1 gram per serving  -Trans Fat- look for 0 grams per serving 6)Exercise at least 120 minutes per week  Exercise Benefits:   -Lower LDL (Bad cholesterol)   -Lower blood pressure   -Increase HDL (Good cholesterol)   -Strengthen heart, lungs and muscles   -Burn calories and relieve stress   -Sleep better and help you feel better overall  To lower your risk of heart disease, limit your intake of saturated fat and trans fat as much as possible. THE GOOD WHAT IT DOES WHERE IT'S FOUND  MONOUNSATURATED FAT Lowers LDL and maybe raises HDL cholesterol Canola oil, olives, olive oil, peanuts, peanut oil, avocados, nuts  POLYUNSATURATED FAT Lowers LDL cholesterol Corn, safflower, sunflower and soybean oils, nuts, seeds  OMEGA-3 FATTY ACIDS Lowers triglycerides (blood fats) and blood pressure Salmon, mackerel,  herring, sardines, flax seed, flaxseed oil, walnuts, soybean oil  THE BAD WHAT IT DOES WHERE IT'S FOUND  SATURATED FAT Raises LDL (bad) cholesterol Butter, shortening, lard, red meat, cheese, whole milk, ice cream, coconut and palm oils  TRANS FAT Raises LDL cholesterol, lowers HDL (good) cholesterol Fried foods, some stick margarines, some cookies and crackers (look for hydrogenated fat on the ingredient list)  CHOLESTEROL FROM FOOD Too much may raise cholesterol levels Meat, poultry, seafood, eggs, milk, cheese, yogurt, butter   Plate Method (How much food of each food group) 1) Fill one half of your plate with nonstarchy vegetables: lettuce, broccoli, green beans, spinach, carrots or peppers. 2) Fill one quarter with protein: chicken, [place]key, fish, lean meat, eggs or tofu. 3) Fill one quarter with a nutritious carbohydrate food: brown rice, whole-wheat pasta, whole-wheat bread, peas, or corn.  Choose whole-wheat carbs for extra nutrition.  Controlling carbs helps you control your blood glucose. 4) Include a small piece of fruit at each meal, as well as 8 ounces of lowfat milk or yogurt. 5) Add 1-2 teaspoons of heart-healthy fat, such as olive or canola oil, trans fat-free margarine, avocado, nuts or seeds.

## 2014-11-09 LAB — LIPID PANEL
Cholesterol: 234 mg/dL — ABNORMAL HIGH (ref 0–200)
HDL: 51 mg/dL (ref >39)
LDL Cholesterol: 156 mg/dL — ABNORMAL HIGH (ref 0–99)
Total CHOL/HDL Ratio: 4.6 Ratio
Triglycerides: 134 mg/dL (ref <150)
VLDL: 27 mg/dL (ref 0–40)

## 2014-11-09 LAB — HEPATIC FUNCTION PANEL
ALBUMIN: 4.9 g/dL (ref 3.5–5.2)
ALK PHOS: 59 U/L (ref 39–117)
ALT: 49 U/L (ref 0–53)
AST: 40 U/L — AB (ref 0–37)
BILIRUBIN INDIRECT: 0.6 mg/dL (ref 0.2–1.2)
BILIRUBIN TOTAL: 0.8 mg/dL (ref 0.2–1.2)
Bilirubin, Direct: 0.2 mg/dL (ref 0.0–0.3)
Total Protein: 7.8 g/dL (ref 6.0–8.3)

## 2014-11-09 LAB — BASIC METABOLIC PANEL WITH GFR
BUN: 22 mg/dL (ref 6–23)
CALCIUM: 10.2 mg/dL (ref 8.4–10.5)
CO2: 28 mEq/L (ref 19–32)
Chloride: 102 mEq/L (ref 96–112)
Creat: 1.37 mg/dL — ABNORMAL HIGH (ref 0.50–1.35)
GFR, EST AFRICAN AMERICAN: 78 mL/min
GFR, Est Non African American: 67 mL/min
Glucose, Bld: 103 mg/dL — ABNORMAL HIGH (ref 70–99)
Potassium: 4.5 mEq/L (ref 3.5–5.3)
SODIUM: 138 meq/L (ref 135–145)

## 2014-11-09 LAB — HEMOGLOBIN A1C
HEMOGLOBIN A1C: 5.7 % — AB (ref <5.7)
MEAN PLASMA GLUCOSE: 117 mg/dL — AB (ref <117)

## 2014-11-09 LAB — INSULIN, FASTING: Insulin fasting, serum: 19.4 u[IU]/mL (ref 2.0–19.6)

## 2014-11-09 LAB — TSH: TSH: 1.193 u[IU]/mL (ref 0.350–4.500)

## 2014-11-09 LAB — VITAMIN D 25 HYDROXY (VIT D DEFICIENCY, FRACTURES): Vit D, 25-Hydroxy: 30 ng/mL (ref 30–100)

## 2014-11-22 ENCOUNTER — Encounter (INDEPENDENT_AMBULATORY_CARE_PROVIDER_SITE_OTHER): Payer: Self-pay

## 2014-12-05 ENCOUNTER — Other Ambulatory Visit: Payer: Self-pay | Admitting: Physician Assistant

## 2015-01-14 IMAGING — CR DG CHEST 2V
2 series · 2 of 2 positions shown · non-contrast
Comparison: None.

CLINICAL DATA: Hypertension, routine medical examination

EXAM:
CHEST  2 VIEW

[view not recorded (1 of 2)]
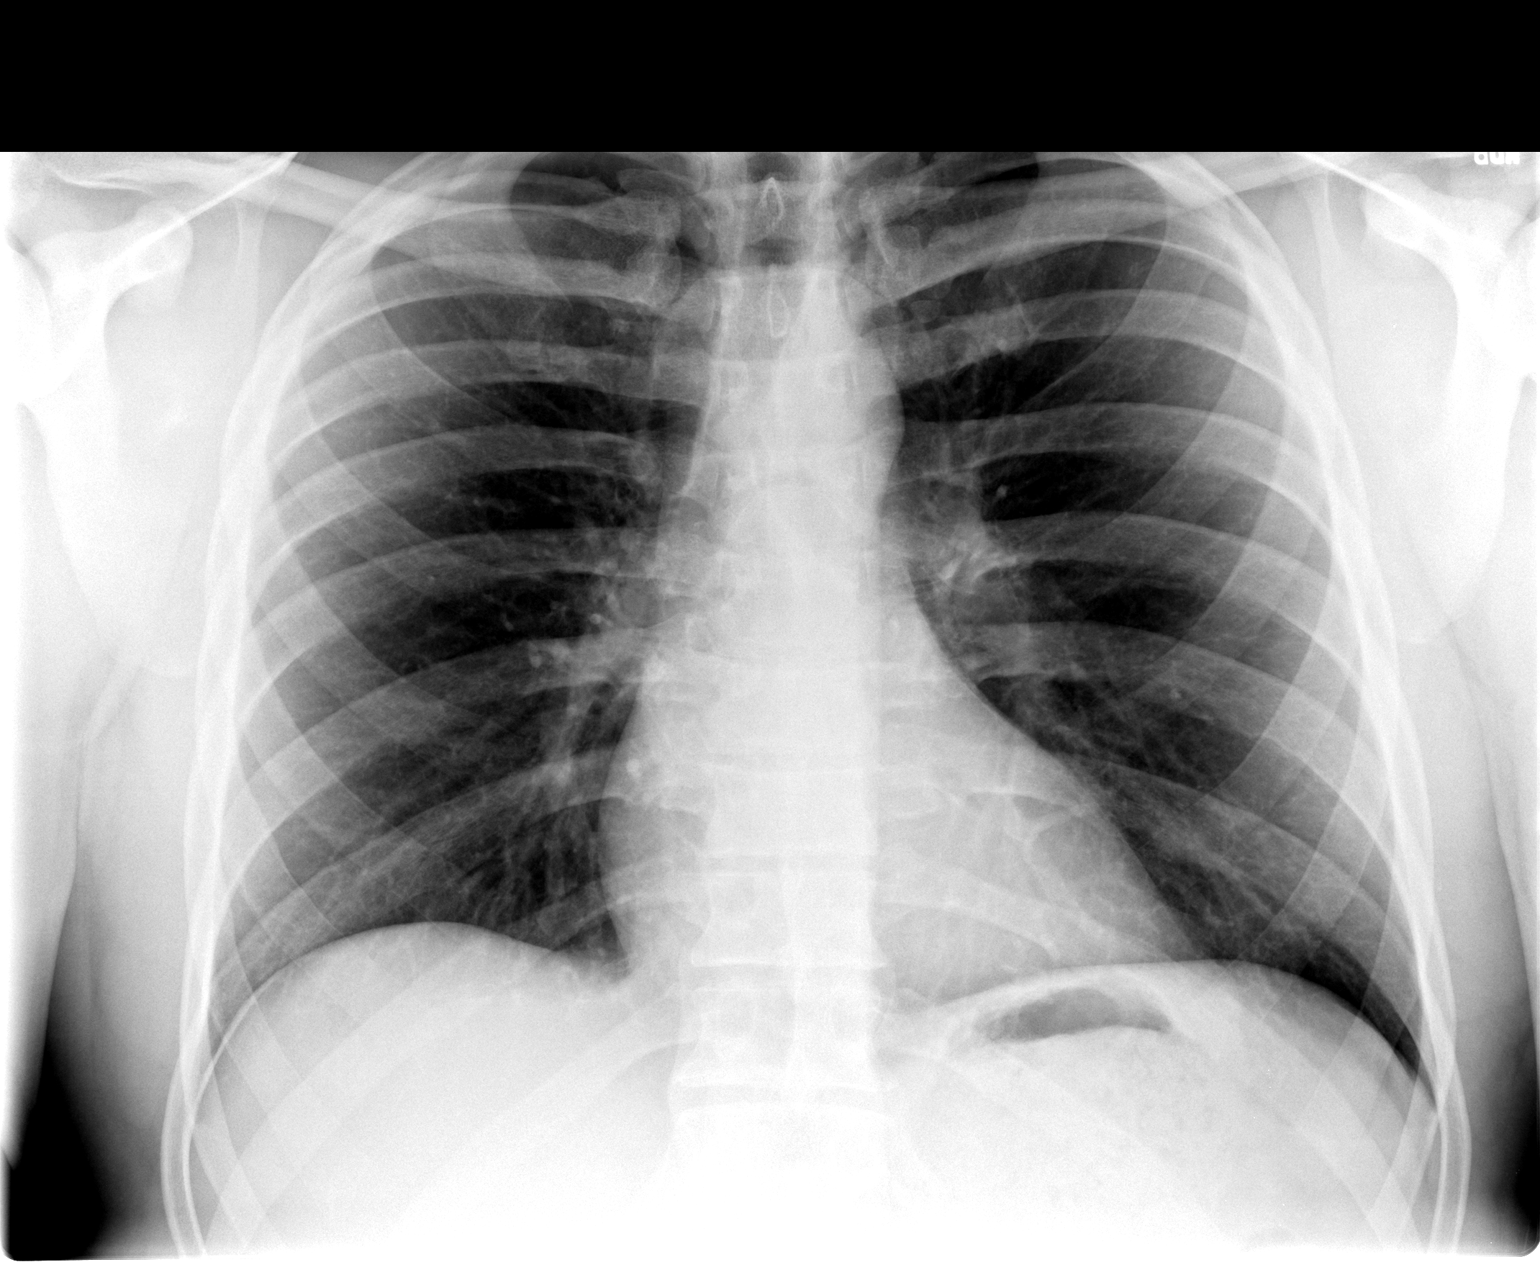

[view not recorded (2 of 2)]
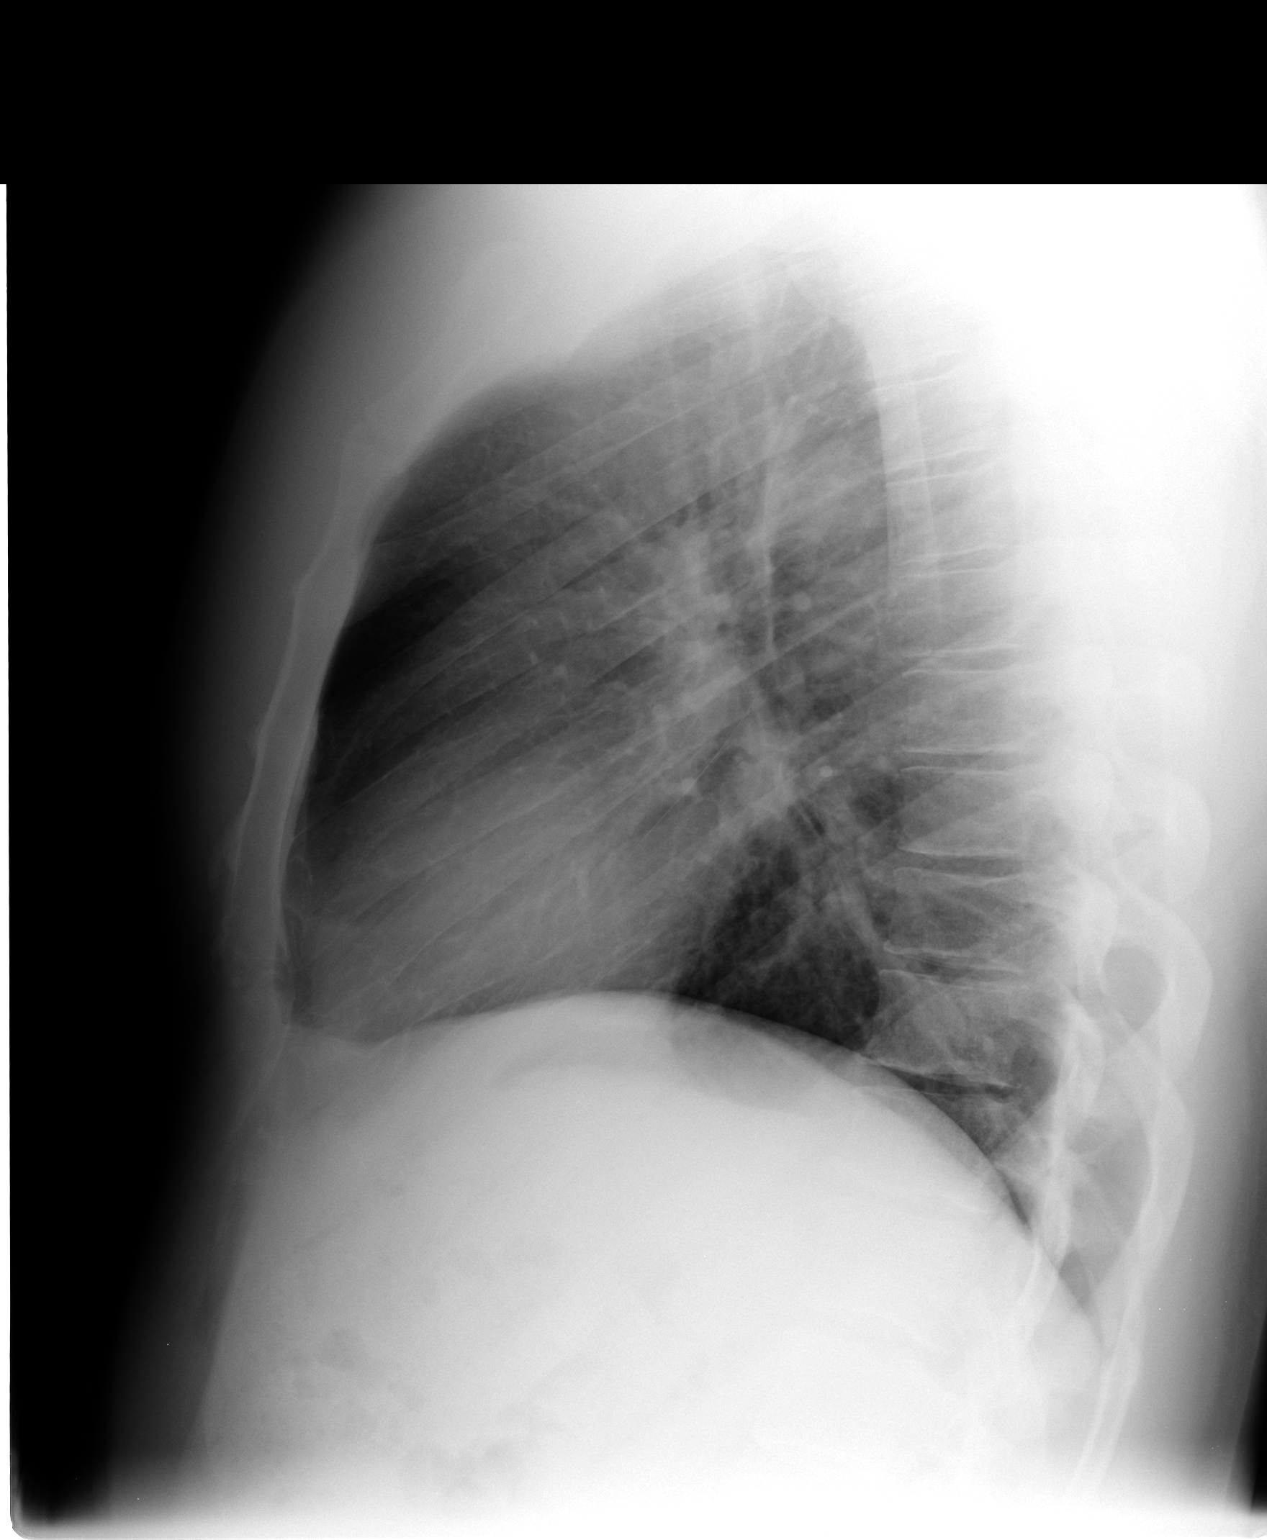

[2 of 2 positions shown; findings below may reference images not displayed]

FINDINGS: The heart size and mediastinal contours are within normal limits.
Both lungs are clear. The visualized skeletal structures are
unremarkable.
IMPRESSION: No active cardiopulmonary disease.

## 2015-02-05 ENCOUNTER — Other Ambulatory Visit: Payer: Self-pay | Admitting: *Deleted

## 2015-02-05 MED ORDER — ATENOLOL 100 MG PO TABS: ORAL_TABLET | ORAL | Status: DC

## 2015-02-05 MED ORDER — CITALOPRAM HYDROBROMIDE 20 MG PO TABS: 20.0000 mg | ORAL_TABLET | Freq: Every day | ORAL | Status: DC

## 2015-02-08 ENCOUNTER — Encounter: Payer: Self-pay | Admitting: Physician Assistant

## 2015-03-26 ENCOUNTER — Encounter: Payer: Self-pay | Admitting: Physician Assistant

## 2015-04-16 ENCOUNTER — Ambulatory Visit (INDEPENDENT_AMBULATORY_CARE_PROVIDER_SITE_OTHER): Payer: 59 | Admitting: Physician Assistant

## 2015-04-16 ENCOUNTER — Encounter: Payer: Self-pay | Admitting: Physician Assistant

## 2015-04-16 VITALS — BP 132/88 | HR 68 | Temp 97.7°F | Resp 16 | Ht 72.0 in | Wt 262.0 lb

## 2015-04-16 DIAGNOSIS — I10 Essential (primary) hypertension: Secondary | ICD-10-CM

## 2015-04-16 DIAGNOSIS — Z0001 Encounter for general adult medical examination with abnormal findings: Secondary | ICD-10-CM

## 2015-04-16 DIAGNOSIS — R7309 Other abnormal glucose: Secondary | ICD-10-CM

## 2015-04-16 DIAGNOSIS — J342 Deviated nasal septum: Secondary | ICD-10-CM

## 2015-04-16 DIAGNOSIS — E785 Hyperlipidemia, unspecified: Secondary | ICD-10-CM

## 2015-04-16 DIAGNOSIS — R6889 Other general symptoms and signs: Secondary | ICD-10-CM

## 2015-04-16 DIAGNOSIS — R7303 Prediabetes: Secondary | ICD-10-CM

## 2015-04-16 DIAGNOSIS — Z79899 Other long term (current) drug therapy: Secondary | ICD-10-CM

## 2015-04-16 DIAGNOSIS — E538 Deficiency of other specified B group vitamins: Secondary | ICD-10-CM

## 2015-04-16 DIAGNOSIS — Z0000 Encounter for general adult medical examination without abnormal findings: Secondary | ICD-10-CM

## 2015-04-16 DIAGNOSIS — E669 Obesity, unspecified: Secondary | ICD-10-CM

## 2015-04-16 DIAGNOSIS — E559 Vitamin D deficiency, unspecified: Secondary | ICD-10-CM

## 2015-04-16 DIAGNOSIS — F411 Generalized anxiety disorder: Secondary | ICD-10-CM

## 2015-04-16 LAB — CBC WITH DIFFERENTIAL/PLATELET
BASOS ABS: 0 10*3/uL (ref 0.0–0.1)
Basophils Relative: 0 % (ref 0–1)
EOS ABS: 0.1 10*3/uL (ref 0.0–0.7)
EOS PCT: 2 % (ref 0–5)
HCT: 43.1 % (ref 39.0–52.0)
Hemoglobin: 14.8 g/dL (ref 13.0–17.0)
LYMPHS ABS: 1.7 10*3/uL (ref 0.7–4.0)
LYMPHS PCT: 33 % (ref 12–46)
MCH: 30.3 pg (ref 26.0–34.0)
MCHC: 34.3 g/dL (ref 30.0–36.0)
MCV: 88.1 fL (ref 78.0–100.0)
MPV: 10.6 fL (ref 8.6–12.4)
Monocytes Absolute: 0.7 10*3/uL (ref 0.1–1.0)
Monocytes Relative: 13 % — ABNORMAL HIGH (ref 3–12)
Neutro Abs: 2.6 10*3/uL (ref 1.7–7.7)
Neutrophils Relative %: 52 % (ref 43–77)
PLATELETS: 204 10*3/uL (ref 150–400)
RBC: 4.89 MIL/uL (ref 4.22–5.81)
RDW: 13.9 % (ref 11.5–15.5)
WBC: 5 10*3/uL (ref 4.0–10.5)

## 2015-04-16 LAB — HEMOGLOBIN A1C
HEMOGLOBIN A1C: 5.8 % — AB (ref <5.7)
Mean Plasma Glucose: 120 mg/dL — ABNORMAL HIGH (ref <117)

## 2015-04-16 MED ORDER — ALPRAZOLAM 0.5 MG PO TABS: 0.5000 mg | ORAL_TABLET | Freq: Two times a day (BID) | ORAL | Status: DC | PRN

## 2015-04-16 MED ORDER — CITALOPRAM HYDROBROMIDE 40 MG PO TABS: 40.0000 mg | ORAL_TABLET | Freq: Every day | ORAL | Status: DC

## 2015-04-16 NOTE — Progress Notes (Signed)
Complete Physical  Assessment and Plan: 1. Essential hypertension - continue medications, DASH diet, exercise and monitor at home. Call if greater than 130/80. - CBC with Differential/Platelet - BASIC METABOLIC PANEL WITH GFR - Hepatic function panel - TSH - Urinalysis, Routine w reflex microscopic - Microalbumin / creatinine urine ratio - EKG 12-Lead  2. Prediabetes Discussed general issues about diabetes pathophysiology and management., Educational material distributed., Suggested low cholesterol diet., Encouraged aerobic exercise., Discussed foot care., Reminded to get yearly retinal exam. - Hemoglobin A1c - Insulin, fasting  3. Obesity (BMI 30-39.9) -Obesity with co morbidities- long discussion about weight loss, diet, and exercise - Testosterone  4. Hyperlipidemia -continue medications, check lipids, decrease fatty foods, increase activity.  - Lipid panel  5. Encounter for long-term (current) use of medications - Magnesium  6. Vitamin D deficiency - Vit D  25 hydroxy (rtn osteoporosis monitoring)  7. Generalized anxiety disorder - start new medication prescribed, stress management techniques discussed, increase water, good sleep hygiene discussed, increase exercise, and increase veggies. At this time he may benefit from a mood stabilizer but he is very resistant to new medications and I believe he would benefit more seeing a psychiatrist to manage his medications. I have expressed this to him and his wife and they will schedule an appointment. I have given xanax 0.5 in the mean time.    8. Routine general medical examination at a health care facility  9. B12 deficiency - Vitamin B12  10. Deviated nasal septum Will refer to ENT for evaluation  Discussed med's effects and SE's. Screening labs and tests as requested with regular follow-up as recommended. Over 40 minutes of exam, counseling, chart review and critical decision making was performed  HPI Patient presents  for a complete physical.   His blood pressure has been controlled at home while on atenolol, today their BP is BP: 132/88 mmHg He does workout. He denies chest pain, shortness of breath, dizziness.  He is not on cholesterol medication, he is on bASA and denies myalgias. His cholesterol is not at goal. The cholesterol last visit was:   Lab Results  Component Value Date   CHOL 234* 11/08/2014   HDL 51 11/08/2014   LDLCALC 156* 11/08/2014   TRIG 134 11/08/2014   CHOLHDL 4.6 11/08/2014   He has been working on diet and exercise for prediabetes,  and denies paresthesia of the feet, polydipsia, polyuria and visual disturbances. Last A1C in the office was:  Lab Results  Component Value Date   HGBA1C 5.7* 11/08/2014   Patient is on Vitamin D supplement.   Lab Results  Component Value Date   VD25OH 30 11/08/2014     BMI is Body mass index is 35.53 kg/(m^2)., he is working on diet and exercise. 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 He sees Jodie Echevaria. But still feels very angry and have a lot of anxiety.  Wife is here with him and states that he has been verbally abusive, denies any physical abuse but they she may move out if he does not change his attitude. He has some OCD tendencies and feels he is unable to control his anger.   B12 last year showed B12 of 302, has been taking MVIT with B12 in it.   He has broken  his nose several times, has had some nose bleeds, feels stuffed up.   Current Medications:  Current Outpatient Prescriptions on File Prior to Visit  Medication Sig Dispense Refill  . atenolol (TENORMIN) 100 MG tablet 1/2 -1 pill nightly for blood pressure 90 tablet 0  . citalopram (CELEXA) 20 MG tablet Take 1 tablet (20 mg total) by mouth daily. 90 tablet 0   No current  facility-administered medications on file prior to visit.   Health Maintenance:   There is no immunization history on file for this patient.  Tetanus: 6 years ago Pneumovax: Prevnar 13: Flu vaccine: Zostavax: DEXA: Colonoscopy: EGD: Eye Exam: Dentist:  Patient Care Team: Lucky CowboyWilliam McKeown, MD as PCP - General (Internal Medicine)  Allergies: No Known Allergies Medical History:  Past Medical History  Diagnosis Date  . Hypertension   . Hyperlipidemia   . Prediabetes   . Anxiety   . B12 deficiency   . Vitamin D deficiency    Surgical History: No past surgical history on file. Family History:  Family History  Problem Relation Age of Onset  . Hypertension Father    Social History:   History  Substance Use Topics  . Smoking status: Never Smoker   . Smokeless tobacco: Never Used  . Alcohol Use: Not on file     Comment: social   Review of Systems:  Review of Systems  Constitutional: Positive for malaise/fatigue. Negative for fever, chills, weight loss and diaphoresis.       Weight gain  HENT: Negative for congestion, ear discharge, ear pain, hearing loss, nosebleeds, sore throat and tinnitus.   Eyes: Negative.   Respiratory: Negative.  Negative for stridor.   Cardiovascular: Positive for chest pain (with anxiety) and palpitations. Negative for orthopnea, claudication, leg swelling and PND.  Gastrointestinal: Negative.   Genitourinary: Negative.   Musculoskeletal: Positive for neck pain (with sneezing). Negative for myalgias, back pain, joint pain and falls.  Skin: Negative.   Neurological: Positive for headaches. Negative for weakness.  Endo/Heme/Allergies: Negative.   Psychiatric/Behavioral: Negative for depression, suicidal ideas, hallucinations, memory loss and substance abuse. The patient is nervous/anxious (with "anger issues" per wife). The patient does not have insomnia.     Physical Exam: Estimated body mass index is 35.53 kg/(m^2) as calculated from the  following:   Height as of this encounter: 6' (1.829 m).   Weight as of this encounter: 262 lb (118.842 kg). BP 132/88 mmHg  Pulse 68  Temp(Src) 97.7 F (36.5 C)  Resp 16  Ht 6' (1.829 m)  Wt 262 lb (118.842 kg)  BMI 35.53 kg/m2 General Appearance: Well nourished, in no apparent distress.  Eyes: PERRLA, EOMs, conjunctiva no swelling or erythema, normal fundi and vessels.  Sinuses: No Frontal/maxillary tenderness  ENT/Mouth: Ext aud canals clear, normal light reflex with TMs without erythema, bulging. Good dentition. No erythema, swelling, or exudate on post pharynx. Tonsils not swollen or erythematous. Hearing normal. Nose deviated to the left with septum deviation.  Neck: Supple, thyroid normal. No bruits  Respiratory: Respiratory effort normal, BS equal bilaterally without rales, rhonchi, wheezing or stridor.  Cardio: RRR without murmurs, rubs or gallops. Brisk peripheral pulses without edema.  Chest: symmetric, with normal excursions and percussion.  Abdomen: Soft, nontender, no guarding, rebound, hernias, masses, or organomegaly.  Lymphatics: Non tender without lymphadenopathy.  Genitourinary:  Musculoskeletal: Full ROM all peripheral extremities,5/5 strength, and normal gait.  Skin: Warm, dry without rashes, lesions, ecchymosis. Neuro: Cranial nerves intact, reflexes equal bilaterally. Normal muscle tone, no  cerebellar symptoms. Sensation intact.  Psych: Awake and oriented X 3, normal affect, Insight and Judgment appropriate.   EKG: WNL no changes.  Quentin Mulling 10:17 AM [facility]

## 2015-04-16 NOTE — Patient Instructions (Addendum)
Please see psychiatrist

## 2015-04-17 LAB — BASIC METABOLIC PANEL WITH GFR
BUN: 17 mg/dL (ref 6–23)
CO2: 23 mEq/L (ref 19–32)
Calcium: 9.4 mg/dL (ref 8.4–10.5)
Chloride: 101 mEq/L (ref 96–112)
Creat: 0.93 mg/dL (ref 0.50–1.35)
GFR, Est Non African American: >89 mL/min
GLUCOSE: 93 mg/dL (ref 70–99)
POTASSIUM: 4.2 meq/L (ref 3.5–5.3)
SODIUM: 137 meq/L (ref 135–145)

## 2015-04-17 LAB — LIPID PANEL
CHOLESTEROL: 202 mg/dL — AB (ref 0–200)
HDL: 48 mg/dL (ref >=40)
LDL CALC: 121 mg/dL — AB (ref 0–99)
Total CHOL/HDL Ratio: 4.2 Ratio
Triglycerides: 163 mg/dL — ABNORMAL HIGH (ref <150)
VLDL: 33 mg/dL (ref 0–40)

## 2015-04-17 LAB — MICROALBUMIN / CREATININE URINE RATIO
Creatinine, Urine: 86.1 mg/dL
Microalb Creat Ratio: 3.5 mg/g (ref 0.0–30.0)
Microalb, Ur: 0.3 mg/dL (ref <2.0)

## 2015-04-17 LAB — HEPATIC FUNCTION PANEL
ALT: 49 U/L (ref 0–53)
AST: 35 U/L (ref 0–37)
Albumin: 4.2 g/dL (ref 3.5–5.2)
Alkaline Phosphatase: 61 U/L (ref 39–117)
Bilirubin, Direct: 0.2 mg/dL (ref 0.0–0.3)
Indirect Bilirubin: 0.7 mg/dL (ref 0.2–1.2)
TOTAL PROTEIN: 6.8 g/dL (ref 6.0–8.3)
Total Bilirubin: 0.9 mg/dL (ref 0.2–1.2)

## 2015-04-17 LAB — URINALYSIS, ROUTINE W REFLEX MICROSCOPIC
BILIRUBIN URINE: NEGATIVE
Glucose, UA: NEGATIVE mg/dL
Hgb urine dipstick: NEGATIVE
Ketones, ur: NEGATIVE mg/dL
LEUKOCYTES UA: NEGATIVE
NITRITE: NEGATIVE
PH: 7.5 (ref 5.0–8.0)
PROTEIN: NEGATIVE mg/dL
Specific Gravity, Urine: <1.005 — ABNORMAL LOW (ref 1.005–1.030)
UROBILINOGEN UA: 0.2 mg/dL (ref 0.0–1.0)

## 2015-04-17 LAB — TESTOSTERONE: Testosterone: 511 ng/dL (ref 300–890)

## 2015-04-17 LAB — MAGNESIUM: Magnesium: 1.7 mg/dL (ref 1.5–2.5)

## 2015-04-17 LAB — INSULIN, FASTING: Insulin fasting, serum: 8.6 u[IU]/mL (ref 2.0–19.6)

## 2015-04-17 LAB — VITAMIN B12: VITAMIN B 12: 376 pg/mL (ref 211–911)

## 2015-04-17 LAB — TSH: TSH: 1.729 u[IU]/mL (ref 0.350–4.500)

## 2015-04-17 LAB — VITAMIN D 25 HYDROXY (VIT D DEFICIENCY, FRACTURES): VIT D 25 HYDROXY: 45 ng/mL (ref 30–100)

## 2015-08-18 ENCOUNTER — Other Ambulatory Visit: Payer: Self-pay | Admitting: Physician Assistant

## 2015-08-19 ENCOUNTER — Other Ambulatory Visit: Payer: Self-pay | Admitting: Internal Medicine

## 2015-08-20 ENCOUNTER — Other Ambulatory Visit: Payer: Self-pay

## 2015-08-20 MED ORDER — CITALOPRAM HYDROBROMIDE 40 MG PO TABS: 40.0000 mg | ORAL_TABLET | Freq: Every day | ORAL | Status: DC

## 2015-09-12 ENCOUNTER — Encounter: Payer: Self-pay | Admitting: Physician Assistant

## 2015-09-12 ENCOUNTER — Ambulatory Visit (INDEPENDENT_AMBULATORY_CARE_PROVIDER_SITE_OTHER): Payer: 59 | Admitting: Physician Assistant

## 2015-09-12 VITALS — BP 118/70 | HR 76 | Temp 97.7°F | Resp 16 | Ht 72.0 in | Wt 268.0 lb

## 2015-09-12 DIAGNOSIS — I10 Essential (primary) hypertension: Secondary | ICD-10-CM

## 2015-09-12 DIAGNOSIS — K219 Gastro-esophageal reflux disease without esophagitis: Secondary | ICD-10-CM | POA: Diagnosis not present

## 2015-09-12 DIAGNOSIS — R635 Abnormal weight gain: Secondary | ICD-10-CM | POA: Diagnosis not present

## 2015-09-12 DIAGNOSIS — F411 Generalized anxiety disorder: Secondary | ICD-10-CM

## 2015-09-12 MED ORDER — HYDROCHLOROTHIAZIDE 25 MG PO TABS: 25.0000 mg | ORAL_TABLET | Freq: Every day | ORAL | Status: DC

## 2015-09-12 MED ORDER — RANITIDINE HCL 300 MG PO TABS: ORAL_TABLET | ORAL | Status: DC

## 2015-09-12 MED ORDER — ALPRAZOLAM 0.5 MG PO TABS: 0.5000 mg | ORAL_TABLET | Freq: Two times a day (BID) | ORAL | Status: DC | PRN

## 2015-09-12 NOTE — Patient Instructions (Addendum)
Cut the atenolol to 1/4 pill for 3 days then stop Start on the fluid pill, 1/2 pill for 3 days then can stay the same or go up to 1 pill daily.   Cut the celexa to 1/4 pill daily or 10 mg  Bring back stool samples, get on zantac 1 pill at night.      Bad carbs also include fruit juice, alcohol, and sweet tea. These are empty calories that do not signal to your brain that you are full.   Please remember the good carbs are still carbs which convert into sugar. So please measure them out no more than 1/2-1 cup of rice, oatmeal, pasta, and beans  Veggies are however free foods! Pile them on.   Not all fruit is created equal. Please see the list below, the fruit at the bottom is higher in sugars than the fruit at the top. Please avoid all dried fruits.

## 2015-09-12 NOTE — Progress Notes (Signed)
Assessment and Plan:  1. Gastroesophageal reflux disease without esophagitis Rule out H pylori, he travels often, zantac sent in No NSAIDS, no alcohol - Helicobacter pylori special antigen; Future  2. Generalized anxiety disorder Continue celexa, take xanax PRN  3. Weight gain Stop atenolol, will add on HCTZ since having mild edema Stop beer, eat better, start working out again May need to check TSH, testosterone, etc at next OV in Dec, declines labs today.   4. Essential hypertension Stop atenolol, switch to HCTZ   Continue diet and meds as discussed. Further disposition pending results of labs. Over 30 minutes of exam, counseling, chart review, and critical decision making was performed  HPI 34 y.o. male  presents for medication review, weight gain, and anxiety.   His blood pressure has been controlled at home, today their BP is BP: 118/70 mmHg  He does workout. He denies chest pain, shortness of breath, dizziness.  He is not on cholesterol medication and denies myalgias. His cholesterol is at goal. The cholesterol last visit was:   Lab Results  Component Value Date   CHOL 202* 04/16/2015   HDL 48 04/16/2015   LDLCALC 121* 04/16/2015   TRIG 163* 04/16/2015   CHOLHDL 4.2 04/16/2015   He has been having heart burn for last month, no NSAIDS, no alcohol. Get on zantac.  Has been having weight gain since last visit, he has been working out still, he does drink about 12 lite beers a week.  Lab Results  Component Value Date   TSH 1.729 04/16/2015    He has been working on diet and exercise for prediabetes, and denies paresthesia of the feet, polydipsia, polyuria and visual disturbances. Last A1C in the office was:  Lab Results  Component Value Date   HGBA1C 5.8* 04/16/2015   Patient is on Vitamin D supplement.   Lab Results  Component Value Date   VD25OH 45 04/16/2015      Current Medications:  Current Outpatient Prescriptions on File Prior to Visit  Medication Sig  Dispense Refill  . ALPRAZolam (XANAX) 0.5 MG tablet Take 1 tablet (0.5 mg total) by mouth 2 (two) times daily as needed for anxiety or sleep. 30 tablet 0  . atenolol (TENORMIN) 100 MG tablet 1/2 -1 pill nightly for blood pressure 90 tablet 0  . citalopram (CELEXA) 40 MG tablet Take 1 tablet (40 mg total) by mouth daily. 90 tablet 2   No current facility-administered medications on file prior to visit.   Medical History:  Past Medical History  Diagnosis Date  . Hypertension   . Hyperlipidemia   . Prediabetes   . Anxiety   . B12 deficiency   . Vitamin D deficiency    Allergies: No Known Allergies   Review of Systems:  Review of Systems  Constitutional: Negative.  Negative for malaise/fatigue.  HENT: Negative.   Eyes: Negative.   Respiratory: Negative.   Cardiovascular: Positive for leg swelling. Negative for chest pain, palpitations, orthopnea, claudication and PND.  Gastrointestinal: Positive for heartburn. Negative for nausea, vomiting, abdominal pain, diarrhea, constipation, blood in stool and melena.  Genitourinary: Negative.   Musculoskeletal: Negative.   Skin: Negative.  Negative for rash.  Neurological: Negative.  Negative for weakness.  Psychiatric/Behavioral: Negative for depression, suicidal ideas, hallucinations, memory loss and substance abuse. The patient is nervous/anxious. The patient does not have insomnia.     Family history- Review and unchanged Social history- Review and unchanged Physical Exam: BP 118/70 mmHg  Pulse 76  Temp(Src) 97.7  F (36.5 C) (Temporal)  Resp 16  Ht 6' (1.829 m)  Wt 268 lb (121.564 kg)  BMI 36.34 kg/m2  SpO2 98% Wt Readings from Last 3 Encounters:  09/12/15 268 lb (121.564 kg)  04/16/15 262 lb (118.842 kg)  11/08/14 256 lb (116.121 kg)   General Appearance: Well nourished, in no apparent distress. Eyes: PERRLA, EOMs, conjunctiva no swelling or erythema Sinuses: No Frontal/maxillary tenderness ENT/Mouth: Ext aud canals clear,  TMs without erythema, bulging. No erythema, swelling, or exudate on post pharynx.  Tonsils not swollen or erythematous. Hearing normal.  Neck: Supple, thyroid normal.  Respiratory: Respiratory effort normal, BS equal bilaterally without rales, rhonchi, wheezing or stridor.  Cardio: RRR with no MRGs. Brisk peripheral pulses without edema.  Abdomen: Soft, + BS,  non tender, no guarding, rebound, hernias, masses. Lymphatics: Non tender without lymphadenopathy.  Musculoskeletal: Full ROM, 5/5 strength, Normal gait Skin: Warm, dry without rashes, lesions, ecchymosis.  Neuro: Cranial nerves intact. Normal muscle tone, no cerebellar symptoms. Psych: Awake and oriented X 3, normal affect, Insight and Judgment appropriate.    Quentin MullingAmanda Chisum Habenicht, PA-C 10:51 AM [facility]

## 2015-10-25 ENCOUNTER — Ambulatory Visit: Payer: Self-pay | Admitting: Physician Assistant

## 2015-10-25 ENCOUNTER — Encounter: Payer: Self-pay | Admitting: Physician Assistant

## 2015-10-25 ENCOUNTER — Ambulatory Visit (INDEPENDENT_AMBULATORY_CARE_PROVIDER_SITE_OTHER): Payer: 59 | Admitting: Physician Assistant

## 2015-10-25 VITALS — BP 150/80 | HR 68 | Temp 97.7°F | Resp 16 | Ht 72.0 in | Wt 267.0 lb

## 2015-10-25 DIAGNOSIS — F411 Generalized anxiety disorder: Secondary | ICD-10-CM

## 2015-10-25 DIAGNOSIS — R7303 Prediabetes: Secondary | ICD-10-CM

## 2015-10-25 DIAGNOSIS — E669 Obesity, unspecified: Secondary | ICD-10-CM

## 2015-10-25 DIAGNOSIS — E785 Hyperlipidemia, unspecified: Secondary | ICD-10-CM | POA: Diagnosis not present

## 2015-10-25 DIAGNOSIS — I10 Essential (primary) hypertension: Secondary | ICD-10-CM | POA: Diagnosis not present

## 2015-10-25 DIAGNOSIS — E538 Deficiency of other specified B group vitamins: Secondary | ICD-10-CM

## 2015-10-25 DIAGNOSIS — R635 Abnormal weight gain: Secondary | ICD-10-CM

## 2015-10-25 LAB — CBC WITH DIFFERENTIAL/PLATELET
Basophils Absolute: 0 10*3/uL (ref 0.0–0.1)
Basophils Relative: 0 % (ref 0–1)
EOS ABS: 0.1 10*3/uL (ref 0.0–0.7)
Eosinophils Relative: 2 % (ref 0–5)
HCT: 43.8 % (ref 39.0–52.0)
HEMOGLOBIN: 15.3 g/dL (ref 13.0–17.0)
Lymphocytes Relative: 34 % (ref 12–46)
Lymphs Abs: 2 10*3/uL (ref 0.7–4.0)
MCH: 30.7 pg (ref 26.0–34.0)
MCHC: 34.9 g/dL (ref 30.0–36.0)
MCV: 88 fL (ref 78.0–100.0)
MONO ABS: 0.6 10*3/uL (ref 0.1–1.0)
MONOS PCT: 11 % (ref 3–12)
MPV: 10.6 fL (ref 8.6–12.4)
Neutro Abs: 3.1 10*3/uL (ref 1.7–7.7)
Neutrophils Relative %: 53 % (ref 43–77)
Platelets: 233 10*3/uL (ref 150–400)
RBC: 4.98 MIL/uL (ref 4.22–5.81)
RDW: 13.3 % (ref 11.5–15.5)
WBC: 5.8 10*3/uL (ref 4.0–10.5)

## 2015-10-25 LAB — COMPREHENSIVE METABOLIC PANEL
ALBUMIN: 4.3 g/dL (ref 3.6–5.1)
ALT: 66 U/L — ABNORMAL HIGH (ref 9–46)
AST: 36 U/L (ref 10–40)
Alkaline Phosphatase: 69 U/L (ref 40–115)
BUN: 17 mg/dL (ref 7–25)
CHLORIDE: 99 mmol/L (ref 98–110)
CO2: 28 mmol/L (ref 20–31)
Calcium: 9.8 mg/dL (ref 8.6–10.3)
Creat: 1.11 mg/dL (ref 0.60–1.35)
Glucose, Bld: 97 mg/dL (ref 65–99)
POTASSIUM: 4.1 mmol/L (ref 3.5–5.3)
SODIUM: 137 mmol/L (ref 135–146)
TOTAL PROTEIN: 7.3 g/dL (ref 6.1–8.1)
Total Bilirubin: 0.5 mg/dL (ref 0.2–1.2)

## 2015-10-25 LAB — HEMOGLOBIN A1C
Hgb A1c MFr Bld: 5.7 % — ABNORMAL HIGH (ref <5.7)
Mean Plasma Glucose: 117 mg/dL — ABNORMAL HIGH (ref <117)

## 2015-10-25 LAB — TSH: TSH: 1.921 u[IU]/mL (ref 0.350–4.500)

## 2015-10-25 MED ORDER — MUPIROCIN CALCIUM 2 % EX CREA: 1.0000 "application " | TOPICAL_CREAM | Freq: Three times a day (TID) | CUTANEOUS | Status: DC

## 2015-10-25 NOTE — Patient Instructions (Addendum)
Monitor your blood pressure at home. Go to the ER if any CP, SOB, nausea, dizziness, severe HA, changes vision/speech  Goal BP:  For patients younger than 60: Goal BP < 140/90. For patients 60 and older: Goal BP < 150/90. For patients with diabetes: Goal BP < 140/90. Your most recent BP: BP: (!) 150/80 mmHg   Take your medications faithfully as instructed. Maintain a healthy weight. Get at least 150 minutes of aerobic exercise per week. Minimize salt intake. Minimize alcohol intake  DASH Eating Plan DASH stands for "Dietary Approaches to Stop Hypertension." The DASH eating plan is a healthy eating plan that has been shown to reduce high blood pressure (hypertension). Additional health benefits may include reducing the risk of type 2 diabetes mellitus, heart disease, and stroke. The DASH eating plan may also help with weight loss. WHAT DO I NEED TO KNOW ABOUT THE DASH EATING PLAN? For the DASH eating plan, you will follow these general guidelines:  Choose foods with a percent daily value for sodium of less than 5% (as listed on the food label).  Use salt-free seasonings or herbs instead of table salt or sea salt.  Check with your health care provider or pharmacist before using salt substitutes.  Eat lower-sodium products, often labeled as "lower sodium" or "no salt added."  Eat fresh foods.  Eat more vegetables, fruits, and low-fat dairy products.  Choose whole grains. Look for the word "whole" as the first word in the ingredient list.  Choose fish and skinless chicken or [place] more often than red meat. Limit fish, poultry, and meat to 6 oz (170 g) each day.  Limit sweets, desserts, sugars, and sugary drinks.  Choose heart-healthy fats.  Limit cheese to 1 oz (28 g) per day.  Eat more home-cooked food and less restaurant, buffet, and fast food.  Limit fried foods.  Cook foods using methods other than frying.  Limit canned vegetables. If you do use them, rinse them well  to decrease the sodium.  When eating at a restaurant, ask that your food be prepared with less salt, or no salt if possible. WHAT FOODS CAN I EAT? Seek help from a dietitian for individual calorie needs. Grains Whole grain or whole wheat bread. Brown rice. Whole grain or whole wheat pasta. Quinoa, bulgur, and whole grain cereals. Low-sodium cereals. Corn or whole wheat flour tortillas. Whole grain cornbread. Whole grain crackers. Low-sodium crackers. Vegetables Fresh or frozen vegetables (raw, steamed, roasted, or grilled). Low-sodium or reduced-sodium tomato and vegetable juices. Low-sodium or reduced-sodium tomato sauce and paste. Low-sodium or reduced-sodium canned vegetables.  Fruits All fresh, canned (in natural juice), or frozen fruits. Meat and Other Protein Products Ground beef (85% or leaner), grass-fed beef, or beef trimmed of fat. Skinless chicken or [place]. Ground chicken or [place]. Pork trimmed of fat. All fish and seafood. Eggs. Dried beans, peas, or lentils. Unsalted nuts and seeds. Unsalted canned beans. Dairy Low-fat dairy products, such as skim or 1% milk, 2% or reduced-fat cheeses, low-fat ricotta or cottage cheese, or plain low-fat yogurt. Low-sodium or reduced-sodium cheeses. Fats and Oils Tub margarines without trans fats. Light or reduced-fat mayonnaise and salad dressings (reduced sodium). Avocado. Safflower, olive, or canola oils. Natural peanut or almond butter. Other Unsalted popcorn and pretzels. The items listed above may not be a complete list of recommended foods or beverages. Contact your dietitian for more options. WHAT FOODS ARE NOT RECOMMENDED? Grains White bread. White pasta. White rice. Refined cornbread. Bagels and croissants. Crackers that  contain trans fat. Vegetables Creamed or fried vegetables. Vegetables in a cheese sauce. Regular canned vegetables. Regular canned tomato sauce and paste. Regular tomato and vegetable juices. Fruits Dried fruits.  Canned fruit in light or heavy syrup. Fruit juice. Meat and Other Protein Products Fatty cuts of meat. Ribs, chicken wings, bacon, sausage, bologna, salami, chitterlings, fatback, hot dogs, bratwurst, and packaged luncheon meats. Salted nuts and seeds. Canned beans with salt. Dairy Whole or 2% milk, cream, half-and-half, and cream cheese. Whole-fat or sweetened yogurt. Full-fat cheeses or blue cheese. Nondairy creamers and whipped toppings. Processed cheese, cheese spreads, or cheese curds. Condiments Onion and garlic salt, seasoned salt, table salt, and sea salt. Canned and packaged gravies. Worcestershire sauce. Tartar sauce. Barbecue sauce. Teriyaki sauce. Soy sauce, including reduced sodium. Steak sauce. Fish sauce. Oyster sauce. Cocktail sauce. Horseradish. Ketchup and mustard. Meat flavorings and tenderizers. Bouillon cubes. Hot sauce. Tabasco sauce. Marinades. Taco seasonings. Relishes. Fats and Oils Butter, stick margarine, lard, shortening, ghee, and bacon fat. Coconut, palm kernel, or palm oils. Regular salad dressings. Other Pickles and olives. Salted popcorn and pretzels. The items listed above may not be a complete list of foods and beverages to avoid. Contact your dietitian for more information. WHERE CAN I FIND MORE INFORMATION? National Heart, Lung, and Blood Institute: [employer].itwww.nhlbi.nih.gov/health/health-topics/topics/dash/ Document Released: 10/30/2011 Document Revised: 03/27/2014 Document Reviewed: 09/14/2013 [facility] Patient Information 2015 [place], [place]. This information is not intended to replace advice given to you by your health care provider. Make sure you discuss any questions you have with your health care provider.  Back Pain, Adult Back pain is very common in adults.The cause of back pain is rarely dangerous and the pain often gets better over time.The cause of your back pain may not be known. Some common causes of back pain include:  Strain of the muscles or  ligaments supporting the spine.  Wear and tear (degeneration) of the spinal disks.  Arthritis.  Direct injury to the back. For many people, back pain may return. Since back pain is rarely dangerous, most people can learn to manage this condition on their own. HOME CARE INSTRUCTIONS Watch your back pain for any changes. The following actions may help to lessen any discomfort you are feeling:  Remain active. It is stressful on your back to sit or stand in one place for long periods of time. Do not sit, drive, or stand in one place for more than 30 minutes at a time. Take short walks on even surfaces as soon as you are able.Try to increase the length of time you walk each day.  Exercise regularly as directed by your health care provider. Exercise helps your back heal faster. It also helps avoid future injury by keeping your muscles strong and flexible.  Do not stay in bed.Resting more than 1-2 days can delay your recovery.  Pay attention to your body when you bend and lift. The most comfortable positions are those that put less stress on your recovering back. Always use proper lifting techniques, including:  Bending your knees.  Keeping the load close to your body.  Avoiding twisting.  Find a comfortable position to sleep. Use a firm mattress and lie on your side with your knees slightly bent. If you lie on your back, put a pillow under your knees.  Avoid feeling anxious or stressed.Stress increases muscle tension and can worsen back pain.It is important to recognize when you are anxious or stressed and learn ways to manage it, such as with exercise.  Take medicines only as directed by your health care provider. Over-the-counter medicines to reduce pain and inflammation are often the most helpful.Your health care provider may prescribe muscle relaxant drugs.These medicines help dull your pain so you can more quickly return to your normal activities and healthy exercise.  Apply ice  to the injured area:  Put ice in a plastic bag.  Place a towel between your skin and the bag.  Leave the ice on for 20 minutes, 2-3 times a day for the first 2-3 days. After that, ice and heat may be alternated to reduce pain and spasms.  Maintain a healthy weight. Excess weight puts extra stress on your back and makes it difficult to maintain good posture. SEEK MEDICAL CARE IF:  You have pain that is not relieved with rest or medicine.  You have increasing pain going down into the legs or buttocks.  You have pain that does not improve in one week.  You have night pain.  You lose weight.  You have a fever or chills. SEEK IMMEDIATE MEDICAL CARE IF:   You develop new bowel or bladder control problems.  You have unusual weakness or numbness in your arms or legs.  You develop nausea or vomiting.  You develop abdominal pain.  You feel faint.   This information is not intended to replace advice given to you by your health care provider. Make sure you discuss any questions you have with your health care provider.   Document Released: 11/10/2005 Document Revised: 12/01/2014 Document Reviewed: 03/14/2014 Elsevier Interactive Patient Education [employer]

## 2015-10-25 NOTE — Progress Notes (Signed)
Assessment and Plan:  1. Gastroesophageal reflux disease without esophagitis Continue zantac  2. Generalized anxiety disorder Continue celexa, take xanax PRN  3. Weight gain Stop beer, eat better, start working out again check TSH, testosterone, etc  4. Essential hypertension Continue HCTZ, monitor BP at home, DASH diet  5. Lower back pain negative straight leg Likely muscular, RICE, declines PT/ meds at this time.   6 foliculitis on bilateral legs Cream sent in, use new razors.    Continue diet and meds as discussed. Further disposition pending results of labs. Over 30 minutes of exam, counseling, chart review, and critical decision making was performed  HPI 34 y.o. male  presents for medication review, weight gain, and anxiety.   His blood pressure has not been checked at home, he did not take BP med today, was switched from BB--> to HCTZ, today their BP is BP: (!) 150/80 mmHg  He does workout. He denies chest pain, shortness of breath, dizziness.  He is not on cholesterol medication and denies myalgias. His cholesterol is at goal. The cholesterol last visit was:   Lab Results  Component Value Date   CHOL 202* 04/16/2015   HDL 48 04/16/2015   LDLCALC 121* 04/16/2015   TRIG 163* 04/16/2015   CHOLHDL 4.2 04/16/2015   Lab Results  Component Value Date   TSH 1.729 04/16/2015    He has been working on diet and exercise for prediabetes, and denies paresthesia of the feet, polydipsia, polyuria and visual disturbances. Last A1C in the office was:  Lab Results  Component Value Date   HGBA1C 5.8* 04/16/2015   Patient is on Vitamin D supplement.   Lab Results  Component Value Date   VD25OH 45 04/16/2015     He has been having back pain x 2 week, lower back pain that wraps around to his groin.  Having mild rash bilateral thighs, nonpuritic.   BMI is Body mass index is 36.2 kg/(m^2)., he is working on diet and exercise. Wt Readings from Last 3 Encounters:  10/25/15 267 lb  (121.11 kg)  09/12/15 268 lb (121.564 kg)  04/16/15 262 lb (118.842 kg)    Current Medications:  Current Outpatient Prescriptions on File Prior to Visit  Medication Sig Dispense Refill  . ALPRAZolam (XANAX) 0.5 MG tablet Take 1 tablet (0.5 mg total) by mouth 2 (two) times daily as needed for anxiety or sleep. 30 tablet 0  . atenolol (TENORMIN) 100 MG tablet 1/2 -1 pill nightly for blood pressure 90 tablet 0  . citalopram (CELEXA) 40 MG tablet Take 1 tablet (40 mg total) by mouth daily. 90 tablet 2  . hydrochlorothiazide (HYDRODIURIL) 25 MG tablet Take 1 tablet (25 mg total) by mouth daily. 30 tablet 3  . ranitidine (ZANTAC) 300 MG tablet Once at night time for 2 weeks, then as needed 30 tablet 1   No current facility-administered medications on file prior to visit.   Medical History:  Past Medical History  Diagnosis Date  . Hypertension   . Hyperlipidemia   . Prediabetes   . Anxiety   . B12 deficiency   . Vitamin D deficiency    Allergies: No Known Allergies   Review of Systems:  Review of Systems  Constitutional: Negative.  Negative for weight loss and malaise/fatigue.  HENT: Negative.   Eyes: Negative.   Respiratory: Negative.   Cardiovascular: Negative for chest pain, palpitations, orthopnea, claudication, leg swelling and PND.  Gastrointestinal: Negative for heartburn, nausea, vomiting, abdominal pain, diarrhea, constipation, blood  in stool and melena.  Genitourinary: Negative.   Musculoskeletal: Positive for back pain. Negative for myalgias, joint pain, falls and neck pain.  Skin: Positive for rash. Negative for itching.  Neurological: Negative.  Negative for weakness.  Psychiatric/Behavioral: Negative for depression, suicidal ideas, hallucinations, memory loss and substance abuse. The patient is nervous/anxious. The patient does not have insomnia.     Family history- Review and unchanged Social history- Review and unchanged Physical Exam: BP 150/80 mmHg  Pulse 68   Temp(Src) 97.7 F (36.5 C) (Temporal)  Resp 16  Ht 6' (1.829 m)  Wt 267 lb (121.11 kg)  BMI 36.20 kg/m2  SpO2 96% Wt Readings from Last 3 Encounters:  10/25/15 267 lb (121.11 kg)  09/12/15 268 lb (121.564 kg)  04/16/15 262 lb (118.842 kg)   General Appearance: Well nourished, in no apparent distress. Eyes: PERRLA, EOMs, conjunctiva no swelling or erythema Sinuses: No Frontal/maxillary tenderness ENT/Mouth: Ext aud canals clear, TMs without erythema, bulging. No erythema, swelling, or exudate on post pharynx.  Tonsils not swollen or erythematous. Hearing normal.  Neck: Supple, thyroid normal.  Respiratory: Respiratory effort normal, BS equal bilaterally without rales, rhonchi, wheezing or stridor.  Cardio: RRR with no MRGs. Brisk peripheral pulses without edema.  Abdomen: Soft, + BS,  non tender, no guarding, rebound, hernias, masses. Lymphatics: Non tender without lymphadenopathy.  Musculoskeletal: Full ROM, 5/5 strength, Normal gait. Patient is able to ambulate well. Gait is not  Antalgic. Straight leg raising with dorsiflexion negative bilaterally for radicular symptoms. Sensory exam in the legs are normal. Knee reflexes are normal Ankle reflexes are normal Strength is normal and symmetric in arms and legs. There is not SI tenderness to palpation.  There isparaspinal muscle spasm.  There is not midline tenderness.  ROM of spine with  limited in all spheres due to pain.  Skin: erythematous areas around hair follicles bilateral thighs where he has shaved, Warm, dry without rashes, lesions, ecchymosis.  Neuro: Cranial nerves intact. Normal muscle tone, no cerebellar symptoms. Psych: Awake and oriented X 3, normal affect, Insight and Judgment appropriate.    Quentin Mulling, PA-C 2:45 PM [facility]

## 2015-10-26 LAB — TESTOSTERONE: Testosterone: 252 ng/dL — ABNORMAL LOW (ref 300–890)

## 2015-11-12 ENCOUNTER — Other Ambulatory Visit: Payer: Self-pay | Admitting: Physician Assistant

## 2015-12-10 ENCOUNTER — Encounter: Payer: Self-pay | Admitting: Physician Assistant

## 2015-12-10 ENCOUNTER — Ambulatory Visit (INDEPENDENT_AMBULATORY_CARE_PROVIDER_SITE_OTHER): Payer: 59 | Admitting: Physician Assistant

## 2015-12-10 VITALS — BP 140/110 | HR 101 | Temp 97.9°F | Resp 16 | Ht 72.0 in | Wt 269.0 lb

## 2015-12-10 DIAGNOSIS — E669 Obesity, unspecified: Secondary | ICD-10-CM

## 2015-12-10 DIAGNOSIS — I10 Essential (primary) hypertension: Secondary | ICD-10-CM

## 2015-12-10 DIAGNOSIS — J329 Chronic sinusitis, unspecified: Secondary | ICD-10-CM | POA: Insufficient documentation

## 2015-12-10 DIAGNOSIS — J0100 Acute maxillary sinusitis, unspecified: Secondary | ICD-10-CM

## 2015-12-10 MED ORDER — LOSARTAN POTASSIUM 100 MG PO TABS: 100.0000 mg | ORAL_TABLET | Freq: Every day | ORAL | Status: DC

## 2015-12-10 MED ORDER — AZITHROMYCIN 250 MG PO TABS: ORAL_TABLET | ORAL | Status: AC

## 2015-12-10 MED ORDER — PREDNISONE 20 MG PO TABS: ORAL_TABLET | ORAL | Status: DC

## 2015-12-10 MED ORDER — PROMETHAZINE-CODEINE 6.25-10 MG/5ML PO SYRP: 5.0000 mL | ORAL_SOLUTION | Freq: Four times a day (QID) | ORAL | Status: DC | PRN

## 2015-12-10 NOTE — Patient Instructions (Addendum)
Get on losartan, start on 1/2 pill daily  Sinusitis can be uncomfortable. People with sinusitis have congestion with yellow/green/gray discharge, sinus pain/pressure, pain around the eyes. Sinus infections almost ALWAYS stem from a viral infection and antibiotics don't work against a virus. Even when bacteria is responsible, the infections usually clear up on their own in a week or so.   PLEASE TRY TO DO OVER THE COUNTER TREATMENT AND PREDNISONE FOR 5-7 DAYS AND IF YOU ARE NOT GETTING BETTER OR GETTING WORSE THEN YOU CAN START ON AN ANTIBIOTIC GIVEN.  Can take the prednisone AT NIGHT WITH DINNER, it take 8-12 hours to start working so it will NOT affect your sleeping if you take it at night with your food!! Take two pills the first night and 1 or two pill the second night and then 1 pill the other nights.   Risk of antibiotic use: About 1 in 4 people who take antibiotics have side effects including stomach problems, dizziness, or rashes. Those problems clear up soon after stopping the drugs, but in rare cases antibiotics can cause severe allergic reaction. Over use of antibiotics also encourages the growth of bacteria that can't be controlled easily with drugs. That makes you more vunerable to antibiotic-resistant infections and undermines the benefits of antibiotics for others.   Waste of Money: Antibiotics often aren't very expensive, but any money spent on unnecessary drugs is money down the drain.   When are antibiotics needed? Only when symptoms last longer than a week.  Start to improve but then worsen again  -It can take up to 2 weeks to feel better.   -If you do not get better in 7-10 days (Have fever, facial pain, dental pain and swelling), then please call the office and it is now appropriate to start an antibiotic.   -Please take Tylenol or Ibuprofen for pain. -Acetaminiphen 325mg  orally every 4-6 hours for pain.  Max: 10 per day -Ibuprofen 200mg  orally every 6-8 hours for pain.   Take with food to avoid ulcers.   Max 10 per day  Please pick one of the over the counter allergy medications below and take it once daily for allergies.  Claritin or loratadine cheapest but likely the weakest  Zyrtec or certizine at night because it can make you sleepy The strongest is allegra or fexafinadine  Cheapest at [facility], [employer], [employer]  -While drinking fluids, pinch and hold nose close and swallow.  This will help open up your eustachian tubes to drain the fluid behind your ear drums. -Try steam showers to open your nasal passages.   Drink lots of water to stay hydrated and to thin mucous.  Flonase/Nasonex is to help the inflammation.  Take 2 sprays in each nostril at bedtime.  Make sure you spray towards the outside of each nostril towards the outer corner of your eye, hold nose close and tilt head back.  This will help the medication get into your sinuses.  If you do not like this medication, then use saline nasal sprays same directions as above for Flonase. Stop the medication right away if you get blurring of your vision or nose bleeds.  Sinusitis Sinusitis is redness, soreness, and inflammation of the paranasal sinuses. Paranasal sinuses are air pockets within the bones of your face (beneath the eyes, the middle of the forehead, or above the eyes). In healthy paranasal sinuses, mucus is able to drain out, and air is able to circulate through them by way of your nose. However, when  your paranasal sinuses are inflamed, mucus and air can become trapped. This can allow bacteria and other germs to grow and cause infection. Sinusitis can develop quickly and last only a short time (acute) or continue over a long period (chronic). Sinusitis that lasts for more than 12 weeks is considered chronic.  CAUSES  Causes of sinusitis include: Allergies. Structural abnormalities, such as displacement of the cartilage that separates your nostrils (deviated septum), which can decrease the air flow  through your nose and sinuses and affect sinus drainage. Functional abnormalities, such as when the small hairs (cilia) that line your sinuses and help remove mucus do not work properly or are not present. SIGNS AND SYMPTOMS  Symptoms of acute and chronic sinusitis are the same. The primary symptoms are pain and pressure around the affected sinuses. Other symptoms include: Upper toothache. Earache. Headache. Bad breath. Decreased sense of smell and taste. A cough, which worsens when you are lying flat. Fatigue. Fever. Thick drainage from your nose, which often is green and may contain pus (purulent). Swelling and warmth over the affected sinuses. DIAGNOSIS  Your health care provider will perform a physical exam. During the exam, your health care provider may: Look in your nose for signs of abnormal growths in your nostrils (nasal polyps).  Tap over the affected sinus to check for signs of infection. View the inside of your sinuses (endoscopy) using an imaging device that has a light attached (endoscope). If your health care provider suspects that you have chronic sinusitis, one or more of the following tests may be recommended: Allergy tests. Nasal culture. A sample of mucus is taken from your nose, sent to a lab, and screened for bacteria. Nasal cytology. A sample of mucus is taken from your nose and examined by your health care provider to determine if your sinusitis is related to an allergy. TREATMENT  Most cases of acute sinusitis are related to a viral infection and will resolve on their own within 10 days. Sometimes medicines are prescribed to help relieve symptoms (pain medicine, decongestants, nasal steroid sprays, or saline sprays).  However, for sinusitis related to a bacterial infection, your health care provider will prescribe antibiotic medicines. These are medicines that will help kill the bacteria causing the infection.  Rarely, sinusitis is caused by a fungal infection. In  theses cases, your health care provider will prescribe antifungal medicine. For some cases of chronic sinusitis, surgery is needed. Generally, these are cases in which sinusitis recurs more than 3 times per year, despite other treatments. HOME CARE INSTRUCTIONS  Drink plenty of water. Water helps thin the mucus so your sinuses can drain more easily. Use a humidifier. Inhale steam 3 to 4 times a day (for example, sit in the bathroom with the shower running). Apply a warm, moist washcloth to your face 3 to 4 times a day, or as directed by your health care provider. Use saline nasal sprays to help moisten and clean your sinuses. Take medicines only as directed by your health care provider. If you were prescribed either an antibiotic or antifungal medicine, finish it all even if you start to feel better. SEEK IMMEDIATE MEDICAL CARE IF: You have increasing pain or severe headaches. You have nausea, vomiting, or drowsiness. You have swelling around your face. You have vision problems. You have a stiff neck. You have difficulty breathing. MAKE SURE YOU:  Understand these instructions. Will watch your condition. Will get help right away if you are not doing well or get worse. Document  Released: 11/10/2005 Document Revised: 03/27/2014 Document Reviewed: 11/25/2011 [facility] Patient Information 2015 [place], [place]. This information is not intended to replace advice given to you by your health care provider. Make sure you discuss any questions you have with your health care provider.   Monitor your blood pressure at home. Go to the ER if any CP, SOB, nausea, dizziness, severe HA, changes vision/speech  Goal BP:  For patients younger than 60: Goal BP < 140/90. For patients 60 and older: Goal BP < 150/90. For patients with diabetes: Goal BP < 140/90. Your most recent BP: BP: (!) 140/110 mmHg   Take your medications faithfully as instructed. Maintain a healthy weight. Get at least 150 minutes of  aerobic exercise per week. Minimize salt intake. Minimize alcohol intake  DASH Eating Plan DASH stands for "Dietary Approaches to Stop Hypertension." The DASH eating plan is a healthy eating plan that has been shown to reduce high blood pressure (hypertension). Additional health benefits may include reducing the risk of type 2 diabetes mellitus, heart disease, and stroke. The DASH eating plan may also help with weight loss. WHAT DO I NEED TO KNOW ABOUT THE DASH EATING PLAN? For the DASH eating plan, you will follow these general guidelines:  Choose foods with a percent daily value for sodium of less than 5% (as listed on the food label).  Use salt-free seasonings or herbs instead of table salt or sea salt.  Check with your health care provider or pharmacist before using salt substitutes.  Eat lower-sodium products, often labeled as "lower sodium" or "no salt added."  Eat fresh foods.  Eat more vegetables, fruits, and low-fat dairy products.  Choose whole grains. Look for the word "whole" as the first word in the ingredient list.  Choose fish and skinless chicken or [place] more often than red meat. Limit fish, poultry, and meat to 6 oz (170 g) each day.  Limit sweets, desserts, sugars, and sugary drinks.  Choose heart-healthy fats.  Limit cheese to 1 oz (28 g) per day.  Eat more home-cooked food and less restaurant, buffet, and fast food.  Limit fried foods.  Cook foods using methods other than frying.  Limit canned vegetables. If you do use them, rinse them well to decrease the sodium.  When eating at a restaurant, ask that your food be prepared with less salt, or no salt if possible. WHAT FOODS CAN I EAT? Seek help from a dietitian for individual calorie needs. Grains Whole grain or whole wheat bread. Brown rice. Whole grain or whole wheat pasta. Quinoa, bulgur, and whole grain cereals. Low-sodium cereals. Corn or whole wheat flour tortillas. Whole grain cornbread. Whole  grain crackers. Low-sodium crackers. Vegetables Fresh or frozen vegetables (raw, steamed, roasted, or grilled). Low-sodium or reduced-sodium tomato and vegetable juices. Low-sodium or reduced-sodium tomato sauce and paste. Low-sodium or reduced-sodium canned vegetables.  Fruits All fresh, canned (in natural juice), or frozen fruits. Meat and Other Protein Products Ground beef (85% or leaner), grass-fed beef, or beef trimmed of fat. Skinless chicken or [place]. Ground chicken or [place]. Pork trimmed of fat. All fish and seafood. Eggs. Dried beans, peas, or lentils. Unsalted nuts and seeds. Unsalted canned beans. Dairy Low-fat dairy products, such as skim or 1% milk, 2% or reduced-fat cheeses, low-fat ricotta or cottage cheese, or plain low-fat yogurt. Low-sodium or reduced-sodium cheeses. Fats and Oils Tub margarines without trans fats. Light or reduced-fat mayonnaise and salad dressings (reduced sodium). Avocado. Safflower, olive, or canola oils. Natural peanut or almond  butter. Other Unsalted popcorn and pretzels. The items listed above may not be a complete list of recommended foods or beverages. Contact your dietitian for more options. WHAT FOODS ARE NOT RECOMMENDED? Grains White bread. White pasta. White rice. Refined cornbread. Bagels and croissants. Crackers that contain trans fat. Vegetables Creamed or fried vegetables. Vegetables in a cheese sauce. Regular canned vegetables. Regular canned tomato sauce and paste. Regular tomato and vegetable juices. Fruits Dried fruits. Canned fruit in light or heavy syrup. Fruit juice. Meat and Other Protein Products Fatty cuts of meat. Ribs, chicken wings, bacon, sausage, bologna, salami, chitterlings, fatback, hot dogs, bratwurst, and packaged luncheon meats. Salted nuts and seeds. Canned beans with salt. Dairy Whole or 2% milk, cream, half-and-half, and cream cheese. Whole-fat or sweetened yogurt. Full-fat cheeses or blue cheese. Nondairy creamers  and whipped toppings. Processed cheese, cheese spreads, or cheese curds. Condiments Onion and garlic salt, seasoned salt, table salt, and sea salt. Canned and packaged gravies. Worcestershire sauce. Tartar sauce. Barbecue sauce. Teriyaki sauce. Soy sauce, including reduced sodium. Steak sauce. Fish sauce. Oyster sauce. Cocktail sauce. Horseradish. Ketchup and mustard. Meat flavorings and tenderizers. Bouillon cubes. Hot sauce. Tabasco sauce. Marinades. Taco seasonings. Relishes. Fats and Oils Butter, stick margarine, lard, shortening, ghee, and bacon fat. Coconut, palm kernel, or palm oils. Regular salad dressings. Other Pickles and olives. Salted popcorn and pretzels. The items listed above may not be a complete list of foods and beverages to avoid. Contact your dietitian for more information. WHERE CAN I FIND MORE INFORMATION? National Heart, Lung, and Blood Institute: [employer] Document Released: 10/30/2011 Document Revised: 03/27/2014 Document Reviewed: 09/14/2013 [facility] Patient Information 2015 [place], [place]. This information is not intended to replace advice given to you by your health care provider. Make sure you discuss any questions you have with your health care provider.

## 2015-12-10 NOTE — Progress Notes (Signed)
Subjective:    Patient ID: Alan Crawford, male    DOB: 16-Jan-1981, 35 y.o.   MRN: [id number]  HPI .35 y.o. WM with 5 days of URI. Remote is also sick, has cough, wheezing, sinus pain. Denies fever, chills. Going out of town for work.   His blood pressure is elevated but he has not been on his HCTZ and has been on OTC medications with sudafed. No CV symptoms.   Blood pressure 140/110, pulse 101, temperature 97.9 F (36.6 C), temperature source Temporal, resp. rate 16, height 6' (1.829 m), weight 269 lb (122.018 kg), SpO2 98 %.   Wt Readings from Last 3 Encounters:  12/10/15 269 lb (122.018 kg)  10/25/15 267 lb (121.11 kg)  09/12/15 268 lb (121.564 kg)    Past Medical History  Diagnosis Date  . Hypertension   . Hyperlipidemia   . Prediabetes   . Anxiety   . B12 deficiency   . Vitamin D deficiency    Current Outpatient Prescriptions on File Prior to Visit  Medication Sig Dispense Refill  . ALPRAZolam (XANAX) 0.5 MG tablet Take 1 tablet (0.5 mg total) by mouth 2 (two) times daily as needed for anxiety or sleep. 30 tablet 0  . citalopram (CELEXA) 40 MG tablet Take 1 tablet (40 mg total) by mouth daily. 90 tablet 2  . hydrochlorothiazide (HYDRODIURIL) 25 MG tablet Take 1 tablet (25 mg total) by mouth daily. 30 tablet 3  . ranitidine (ZANTAC) 300 MG tablet TAKE 1 TABLET AT NIGHT FOR 2 WEEKS THEN AS NEEDED 30 tablet 3  . mupirocin cream (BACTROBAN) 2 % Apply 1 application topically 3 (three) times daily. For 7-14 days (Patient not taking: Reported on 12/10/2015) 30 g 2   No current facility-administered medications on file prior to visit.      Review of Systems  Constitutional: Negative for fever, chills and diaphoresis.  HENT: Positive for congestion, sinus pressure, sneezing and sore throat. Negative for ear pain, trouble swallowing and voice change.   Eyes: Negative.   Respiratory: Positive for cough. Negative for chest tightness, shortness of breath and wheezing.    Cardiovascular: Negative.   Gastrointestinal: Negative.   Genitourinary: Negative.   Musculoskeletal: Negative for neck pain.  Neurological: Negative.  Negative for headaches.       Objective:   Physical Exam  Constitutional: He is oriented to person, place, and time. He appears well-developed and well-nourished.  HENT:  Head: Normocephalic and atraumatic.  Right Ear: Hearing and tympanic membrane normal.  Left Ear: Hearing and tympanic membrane normal.  Nose: Right sinus exhibits maxillary sinus tenderness. Left sinus exhibits maxillary sinus tenderness.  Mouth/Throat: Uvula is midline and mucous membranes are normal. Posterior oropharyngeal erythema present. No oropharyngeal exudate, posterior oropharyngeal edema or tonsillar abscesses.  Eyes: Conjunctivae are normal. Pupils are equal, round, and reactive to light.  Neck: Normal range of motion. Neck supple.  Cardiovascular: Normal rate and regular rhythm.   Pulmonary/Chest: Effort normal and breath sounds normal.  Abdominal: Soft. Bowel sounds are normal.  Musculoskeletal: Normal range of motion.  Lymphadenopathy:    He has no cervical adenopathy.  Neurological: He is alert and oriented to person, place, and time.  Skin: Skin is warm and dry. No rash noted.       Assessment & Plan:  1. Essential hypertension Start losartan, no CV symptoms, follow up 1 month, monitor at home Go to the ER if any CP, SOB, nausea, dizziness, severe HA, changes vision/speech  2. Acute  maxillary sinusitis, recurrence not specified Zpak, prednisone, cough syrup, allergy pill.   3. Obesity with co morbidities - long discussion about weight loss, diet, and exercise

## 2016-02-05 ENCOUNTER — Encounter: Payer: Self-pay | Admitting: Physician Assistant

## 2016-02-05 ENCOUNTER — Ambulatory Visit (INDEPENDENT_AMBULATORY_CARE_PROVIDER_SITE_OTHER): Payer: 59 | Admitting: Physician Assistant

## 2016-02-05 VITALS — BP 140/90 | HR 78 | Temp 98.1°F | Resp 16 | Ht 72.0 in | Wt 264.6 lb

## 2016-02-05 DIAGNOSIS — R7303 Prediabetes: Secondary | ICD-10-CM | POA: Diagnosis not present

## 2016-02-05 DIAGNOSIS — E538 Deficiency of other specified B group vitamins: Secondary | ICD-10-CM | POA: Diagnosis not present

## 2016-02-05 DIAGNOSIS — I10 Essential (primary) hypertension: Secondary | ICD-10-CM | POA: Diagnosis not present

## 2016-02-05 DIAGNOSIS — E559 Vitamin D deficiency, unspecified: Secondary | ICD-10-CM

## 2016-02-05 DIAGNOSIS — E291 Testicular hypofunction: Secondary | ICD-10-CM | POA: Diagnosis not present

## 2016-02-05 DIAGNOSIS — J0100 Acute maxillary sinusitis, unspecified: Secondary | ICD-10-CM

## 2016-02-05 DIAGNOSIS — E669 Obesity, unspecified: Secondary | ICD-10-CM | POA: Diagnosis not present

## 2016-02-05 DIAGNOSIS — E785 Hyperlipidemia, unspecified: Secondary | ICD-10-CM

## 2016-02-05 DIAGNOSIS — Z79899 Other long term (current) drug therapy: Secondary | ICD-10-CM | POA: Diagnosis not present

## 2016-02-05 LAB — LIPID PANEL
CHOL/HDL RATIO: 5.3 ratio — AB (ref <=5.0)
CHOLESTEROL: 184 mg/dL (ref 125–200)
HDL: 35 mg/dL — AB (ref >=40)
LDL Cholesterol: 130 mg/dL — ABNORMAL HIGH (ref <130)
TRIGLYCERIDES: 97 mg/dL (ref <150)
VLDL: 19 mg/dL (ref <30)

## 2016-02-05 LAB — BASIC METABOLIC PANEL WITH GFR
BUN: 12 mg/dL (ref 7–25)
CALCIUM: 9.7 mg/dL (ref 8.6–10.3)
CO2: 27 mmol/L (ref 20–31)
Chloride: 101 mmol/L (ref 98–110)
Creat: 1.02 mg/dL (ref 0.60–1.35)
Glucose, Bld: 94 mg/dL (ref 65–99)
POTASSIUM: 4.4 mmol/L (ref 3.5–5.3)
SODIUM: 140 mmol/L (ref 135–146)

## 2016-02-05 LAB — MAGNESIUM: Magnesium: 2 mg/dL (ref 1.5–2.5)

## 2016-02-05 LAB — HEPATIC FUNCTION PANEL
ALK PHOS: 74 U/L (ref 40–115)
ALT: 38 U/L (ref 9–46)
AST: 29 U/L (ref 10–40)
Albumin: 4.5 g/dL (ref 3.6–5.1)
BILIRUBIN DIRECT: 0.1 mg/dL (ref <=0.2)
BILIRUBIN INDIRECT: 0.3 mg/dL (ref 0.2–1.2)
Total Bilirubin: 0.4 mg/dL (ref 0.2–1.2)
Total Protein: 7.5 g/dL (ref 6.1–8.1)

## 2016-02-05 LAB — CBC WITH DIFFERENTIAL/PLATELET
BASOS ABS: 0.1 10*3/uL (ref 0.0–0.1)
BASOS PCT: 1 % (ref 0–1)
Eosinophils Absolute: 0.1 10*3/uL (ref 0.0–0.7)
Eosinophils Relative: 2 % (ref 0–5)
HCT: 42.7 % (ref 39.0–52.0)
HEMOGLOBIN: 14.4 g/dL (ref 13.0–17.0)
LYMPHS PCT: 34 % (ref 12–46)
Lymphs Abs: 2.3 10*3/uL (ref 0.7–4.0)
MCH: 29.8 pg (ref 26.0–34.0)
MCHC: 33.7 g/dL (ref 30.0–36.0)
MCV: 88.4 fL (ref 78.0–100.0)
MONO ABS: 0.6 10*3/uL (ref 0.1–1.0)
MPV: 10.4 fL (ref 8.6–12.4)
Monocytes Relative: 9 % (ref 3–12)
NEUTROS ABS: 3.7 10*3/uL (ref 1.7–7.7)
Neutrophils Relative %: 54 % (ref 43–77)
Platelets: 272 10*3/uL (ref 150–400)
RBC: 4.83 MIL/uL (ref 4.22–5.81)
RDW: 13.5 % (ref 11.5–15.5)
WBC: 6.8 10*3/uL (ref 4.0–10.5)

## 2016-02-05 MED ORDER — AZITHROMYCIN 250 MG PO TABS: ORAL_TABLET | ORAL | Status: AC

## 2016-02-05 MED ORDER — BISOPROLOL-HYDROCHLOROTHIAZIDE 10-6.25 MG PO TABS: 1.0000 | ORAL_TABLET | Freq: Every day | ORAL | Status: DC

## 2016-02-05 NOTE — Patient Instructions (Signed)
Stop the fluid pill, start the bisoprolol 1/2 pill at first and then go to to 1 pill if BP stays above 140/90  Cut the celexa in half x 1 month Can stay here or start to take everyother day for 1 month  Check out sublingual B12  HOW TO TREAT VIRAL COUGH AND COLD SYMPTOMS:  -Symptoms usually last at least 1 week with the worst symptoms being around day 4.  - colds usually start with a sore throat and end with a cough, and the cough can take 2 weeks to get better.  -No antibiotics are needed for colds, flu, sore throats, cough, bronchitis UNLESS symptoms are longer than 7 days OR if you are getting better then get drastically worse.  -There are a lot of combination medications (Dayquil, Nyquil, Vicks 44, tyelnol cold and sinus, ETC). Please look at the ingredients on the back so that you are treating the correct symptoms and not doubling up on medications/ingredients.    Medicines you can use  Nasal congestion  - pseudoephedrine (Sudafed)- behind the counter, do not use if you have high blood pressure, medicine that have -D in them.  - phenylephrine (Sudafed PE) -Dextormethorphan + chlorpheniramine (Coridcidin HBP)- okay if you have high blood pressure -Oxymetazoline (Afrin) nasal spray- LIMIT to 3 days -Saline nasal spray -Neti pot (used distilled or bottled water)  Ear pain/congestion  -pseudoephedrine (sudafed) - Nasonex/flonase nasal spray  Fever  -Acetaminophen (Tyelnol) -Ibuprofen (Advil, motrin, aleve)  Sore Throat  -Acetaminophen (Tyelnol) -Ibuprofen (Advil, motrin, aleve) -Drink a lot of water -Gargle with salt water - Rest your voice (don't talk) -Throat sprays -Cough drops  Body Aches  -Acetaminophen (Tyelnol) -Ibuprofen (Advil, motrin, aleve)  Headache  -Acetaminophen (Tyelnol) -Ibuprofen (Advil, motrin, aleve) - Exedrin, Exedrin Migraine  Allergy symptoms (cough, sneeze, runny nose, itchy eyes) -Claritin or loratadine cheapest but likely the weakest   -Zyrtec or certizine at night because it can make you sleepy -The strongest is allegra or fexafinadine  Cheapest at [facility], [employer], [employer]  Cough  -Dextromethorphan (Delsym)- medicine that has DM in it -Guafenesin (Mucinex/Robitussin) - cough drops - drink lots of water  Chest Congestion  -Guafenesin (Mucinex/Robitussin)  Red Itchy Eyes  - Naphcon-A  Upset Stomach  - Bland diet (nothing spicy, greasy, fried, and high acid foods like tomatoes, oranges, berries) -OKAY- cereal, bread, soup, crackers, rice -Eat smaller more frequent meals -reduce caffeine, no alcohol -Loperamide (Imodium-AD) if diarrhea -Prevacid for heart burn  General health when sick  -Hydration -wash your hands frequently -keep surfaces clean -change pillow cases and sheets often -Get fresh air but do not exercise strenuously -Vitamin D, double up on it - Vitamin C -Zinc

## 2016-02-05 NOTE — Progress Notes (Signed)
Assessment and Plan:  1. Essential hypertension - CBC with Differential/Platelet - BASIC METABOLIC PANEL WITH GFR - Hepatic function panel - TSH  2. Acute maxillary sinusitis, recurrence not specified zpak  3. Obesity (BMI 30-39.9) Obesity with co morbidities- long discussion about weight loss, diet, and exercise  4. Prediabetes - Hemoglobin A1c  5. Hyperlipidemia - Lipid panel  6. B12 deficiency - Vitamin B12  7. Vitamin D deficiency - VITAMIN D 25 Hydroxy (Vit-D Deficiency, Fractures)  8. Medication management - Magnesium  9. Hypogonadism male - Testosterone   Continue diet and meds as discussed. Further disposition pending results of labs. Over 30 minutes of exam, counseling, chart review, and critical decision making was performed  HPI 35 y.o. male  presents for medication review, weight gain, and anxiety.   His blood pressure has not been checked at home, he did not take BP med today, was switched from BB--> to HCTZ, today their BP is BP: 140/90 mmHg  He does workout. He denies chest pain, shortness of breath, dizziness. He complains of sinus congestion, subjective fever/chills, has taken ibuprofen a few times that has helped. On tessalon, flonase, not on allergy pill.   He is not on cholesterol medication and denies myalgias. His cholesterol is at goal. The cholesterol last visit was:   Lab Results  Component Value Date   CHOL 202* 04/16/2015   HDL 48 04/16/2015   LDLCALC 121* 04/16/2015   TRIG 163* 04/16/2015   CHOLHDL 4.2 04/16/2015   Lab Results  Component Value Date   TSH 1.921 10/25/2015    He has been working on diet and exercise for prediabetes, and denies paresthesia of the feet, polydipsia, polyuria and visual disturbances. Last A1C in the office was:  Lab Results  Component Value Date   HGBA1C 5.7* 10/25/2015   Patient is on Vitamin D supplement.   Lab Results  Component Value Date   VD25OH 45 04/16/2015      BMI is Body mass index is  35.88 kg/(m^2)., he is working on diet and exercise. Wt Readings from Last 3 Encounters:  02/05/16 264 lb 9.6 oz (120.022 kg)  12/10/15 269 lb (122.018 kg)  10/25/15 267 lb (121.11 kg)   He has a history of testosterone deficiency and is on testosterone replacement. He states that the testosterone helps with his energy, libido, muscle mass. Lab Results  Component Value Date   TESTOSTERONE 252* 10/25/2015      Current Medications:  Current Outpatient Prescriptions on File Prior to Visit  Medication Sig Dispense Refill  . ALPRAZolam (XANAX) 0.5 MG tablet Take 1 tablet (0.5 mg total) by mouth 2 (two) times daily as needed for anxiety or sleep. 30 tablet 0  . citalopram (CELEXA) 40 MG tablet Take 1 tablet (40 mg total) by mouth daily. 90 tablet 2  . hydrochlorothiazide (HYDRODIURIL) 25 MG tablet Take 1 tablet (25 mg total) by mouth daily. 30 tablet 3  . losartan (COZAAR) 100 MG tablet Take 1 tablet (100 mg total) by mouth daily. 30 tablet 11  . mupirocin cream (BACTROBAN) 2 % Apply 1 application topically 3 (three) times daily. For 7-14 days 30 g 2  . ranitidine (ZANTAC) 300 MG tablet TAKE 1 TABLET AT NIGHT FOR 2 WEEKS THEN AS NEEDED 30 tablet 3   No current facility-administered medications on file prior to visit.   Medical History:  Past Medical History  Diagnosis Date  . Hypertension   . Hyperlipidemia   . Prediabetes   . Anxiety   .  B12 deficiency   . Vitamin D deficiency    Allergies: No Known Allergies   Review of Systems:  Review of Systems  Constitutional: Negative.  Negative for weight loss and malaise/fatigue.  HENT: Negative.   Eyes: Negative.   Respiratory: Negative.   Cardiovascular: Negative for chest pain, palpitations, orthopnea, claudication, leg swelling and PND.  Gastrointestinal: Negative for heartburn, nausea, vomiting, abdominal pain, diarrhea, constipation, blood in stool and melena.  Genitourinary: Negative.   Musculoskeletal: Negative for myalgias,  back pain, joint pain, falls and neck pain.  Skin: Negative for itching and rash.  Neurological: Negative.  Negative for weakness.  Psychiatric/Behavioral: Negative for depression, suicidal ideas, hallucinations, memory loss and substance abuse. The patient is nervous/anxious. The patient does not have insomnia.     Family history- Review and unchanged Social history- Review and unchanged Physical Exam: BP 140/90 mmHg  Pulse 78  Temp(Src) 98.1 F (36.7 C) (Temporal)  Resp 16  Ht 6' (1.829 m)  Wt 264 lb 9.6 oz (120.022 kg)  BMI 35.88 kg/m2  SpO2 98% Wt Readings from Last 3 Encounters:  02/05/16 264 lb 9.6 oz (120.022 kg)  12/10/15 269 lb (122.018 kg)  10/25/15 267 lb (121.11 kg)   General Appearance: Well nourished, in no apparent distress. Eyes: PERRLA, EOMs, conjunctiva no swelling or erythema Sinuses: No Frontal/maxillary tenderness ENT/Mouth: Ext aud canals clear, TMs without erythema, bulging. No erythema, swelling, or exudate on post pharynx.  Tonsils not swollen or erythematous. Hearing normal.  Neck: Supple, thyroid normal.  Respiratory: Respiratory effort normal, BS equal bilaterally without rales, rhonchi, wheezing or stridor.  Cardio: RRR with no MRGs. Brisk peripheral pulses without edema.  Abdomen: Soft, + BS,  non tender, no guarding, rebound, hernias, masses. Lymphatics: Non tender without lymphadenopathy.  Musculoskeletal: Full ROM, 5/5 strength, Normal gait.  Neuro: Cranial nerves intact. Normal muscle tone, no cerebellar symptoms. Psych: Awake and oriented X 3, normal affect, Insight and Judgment appropriate.    Quentin Mulling, PA-C 4:21 PM [facility]

## 2016-02-06 LAB — TESTOSTERONE: Testosterone: 283 ng/dL (ref 250–827)

## 2016-02-06 LAB — VITAMIN D 25 HYDROXY (VIT D DEFICIENCY, FRACTURES): Vit D, 25-Hydroxy: 55 ng/mL (ref 30–100)

## 2016-02-06 LAB — VITAMIN B12: VITAMIN B 12: 659 pg/mL (ref 200–1100)

## 2016-02-06 LAB — TSH: TSH: 1.77 mIU/L (ref 0.40–4.50)

## 2016-02-06 LAB — HEMOGLOBIN A1C
Hgb A1c MFr Bld: 5.7 % — ABNORMAL HIGH (ref <5.7)
Mean Plasma Glucose: 117 mg/dL — ABNORMAL HIGH (ref <117)

## 2016-02-18 ENCOUNTER — Encounter: Payer: Self-pay | Admitting: Internal Medicine

## 2016-02-18 ENCOUNTER — Other Ambulatory Visit: Payer: Self-pay | Admitting: Internal Medicine

## 2016-02-18 ENCOUNTER — Ambulatory Visit (INDEPENDENT_AMBULATORY_CARE_PROVIDER_SITE_OTHER): Payer: 59 | Admitting: Internal Medicine

## 2016-02-18 VITALS — BP 152/98 | HR 68 | Temp 97.7°F | Resp 16 | Ht 72.0 in | Wt 262.2 lb

## 2016-02-18 DIAGNOSIS — G9001 Carotid sinus syncope: Secondary | ICD-10-CM

## 2016-02-18 MED ORDER — PREDNISONE 20 MG PO TABS: ORAL_TABLET | ORAL | Status: DC

## 2016-02-18 NOTE — Progress Notes (Signed)
  Subjective:    Patient ID: Alan Crawford, male    DOB: 08-25-1981, 35 y.o.   MRN: [id number]  HPI   Patient presents with a 3 day hx/o tenderness in the right side of his neck not associated with S/T, swallowing or head congestion.   Note BP is elevated today and apparently patient misunderstood and stopped his Losaratan when he was Rx'd the Ziac recently for elevated BP.   Medication Sig  . ALPRAZolam (XANAX) 0.5 MG tablet Take 1 tablet (0.5 mg total) by mouth 2 (two) times daily as needed for anxiety or sleep.  . bisoprolol-hctz (ZIAC) 10-6.25 MG tablet Take 1 tablet by mouth daily.  . citalopram (CELEXA) 40 MG tablet Take 1 tablet (40 mg total) by mouth daily.  Marland Kitchen. ipratropium (ATROVENT)  nasal spray APPLY 2 SPRAYS INTO EACH NOSTRIL 3 (THREE) TIMES A DAY FOR 4 DAYS.  Marland Kitchen. losartan (COZAAR) 100 MG tablet Take 1 tablet (100 mg total) by mouth daily. OFF - Stopped recently when started Ziac.   . mupirocin cream (BACTROBAN) 2 % Apply 1 application topically 3 (three) times daily. For 7-14 days   No Known Allergies   Past Medical History  Diagnosis Date  . Hypertension   . Hyperlipidemia   . Prediabetes   . Anxiety   . B12 deficiency   . Vitamin D deficiency    Review of Systems 10 point systems review negative except as above.    Objective:   Physical Exam  BP 152/98    Pulse 68  Temp 97.7 F   Resp 16  Ht 6'   Wt 262 lb 3.2 oz     BMI 35.55  Skin- clear w/o exposed rashes HEENT - Eac's patent. TM's Nl. EOM's full. PERRLA. NasoOroPharynx clear. Neck - supple. Nl Thyroid. Carotid pulsations are normal, but mid R carotid is tender to palpitation.  No bruits, nodes, JVD Chest - Clear equal BS w/o Rales, rhonchi, wheezes. Cor - Nl HS. RRR w/o sig MGR. PP 1(+). No edema. MS- FROM w/o deformities. Muscle power, tone and bulk Nl. Gait Nl. Neuro - No obvious Cr N abnormalities. Sensory, motor and Cerebellar functions appear Nl w/o focal abnormalities. Psyche - Mental status normal  & appropriate.  No delusions, ideations or obvious mood abnormalities.    Assessment & Plan:    1. Carotidynia  - predniSONE (DELTASONE) 20 MG tablet; 1 tab 3 x day for 3 days, then 1 tab 2 x day for 3 days, then 1 tab 1 x day for 5 days  Dispense: 20 tablet; Refill: 0  2 . HTN  - Advised restart Losartan with Ziac and monitor BP's and ROV in 3 weeks to assess.

## 2016-02-18 NOTE — Patient Instructions (Signed)
Restart Losartan for BP  ===============================  Continue Ziac  (isoprolol/HCTZ) ++++++++++++++++++++++++++++++++

## 2016-02-21 ENCOUNTER — Ambulatory Visit: Payer: Self-pay | Admitting: Internal Medicine

## 2016-03-04 ENCOUNTER — Other Ambulatory Visit: Payer: Self-pay | Admitting: Physician Assistant

## 2016-03-11 ENCOUNTER — Ambulatory Visit: Payer: Self-pay | Admitting: Physician Assistant

## 2016-03-17 ENCOUNTER — Encounter: Payer: Self-pay | Admitting: Physician Assistant

## 2016-03-17 ENCOUNTER — Ambulatory Visit (INDEPENDENT_AMBULATORY_CARE_PROVIDER_SITE_OTHER): Payer: 59 | Admitting: Physician Assistant

## 2016-03-17 VITALS — BP 140/100 | HR 66 | Temp 97.7°F | Resp 16 | Ht 72.0 in | Wt 271.8 lb

## 2016-03-17 DIAGNOSIS — I10 Essential (primary) hypertension: Secondary | ICD-10-CM

## 2016-03-17 DIAGNOSIS — G9001 Carotid sinus syncope: Secondary | ICD-10-CM

## 2016-03-17 NOTE — Progress Notes (Signed)
Assessment and Plan: HTN- increase losartan to 1 pill daily Right carotid tenderness and allergies- get on flonase.    Future Appointments Date Time Provider Department Center  04/15/2016 10:00 AM Quentin MullingAmanda Kateleen Encarnacion, PA-C [id number] None    HPI 35 y.o.male presents for recheck of BP and carotidynia.  Given prednisone on 03/27 for right carotid tenderness and was restarted on losartan 100mg  1/2 and ziac 10 for BP control. He states he has had a very stressful morning at work. He does not check his blood pressure at home.  BP Readings from Last 3 Encounters:  03/17/16 140/100  02/18/16 152/98  02/05/16 140/90   BMI is Body mass index is 36.85 kg/(m^2)., he is working on diet and exercise. Wt Readings from Last 3 Encounters:  03/17/16 271 lb 12.8 oz (123.288 kg)  02/18/16 262 lb 3.2 oz (118.933 kg)  02/05/16 264 lb 9.6 oz (120.022 kg)    Past Medical History  Diagnosis Date  . Hypertension   . Hyperlipidemia   . Prediabetes   . Anxiety   . B12 deficiency   . Vitamin D deficiency      No Known Allergies    Current Outpatient Prescriptions on File Prior to Visit  Medication Sig Dispense Refill  . ALPRAZolam (XANAX) 0.5 MG tablet Take 1 tablet (0.5 mg total) by mouth 2 (two) times daily as needed for anxiety or sleep. 30 tablet 0  . bisoprolol-hydrochlorothiazide (ZIAC) 10-6.25 MG tablet TAKE 1 TABLET BY MOUTH DAILY. 30 tablet 0  . citalopram (CELEXA) 40 MG tablet Take 1 tablet (40 mg total) by mouth daily. 90 tablet 2  . losartan (COZAAR) 100 MG tablet     . predniSONE (DELTASONE) 20 MG tablet 1 tab 3 x day for 3 days, then 1 tab 2 x day for 3 days, then 1 tab 1 x day for 5 days 20 tablet 0   No current facility-administered medications on file prior to visit.    ROS: all negative except above.   Physical Exam: There were no vitals filed for this visit. There were no vitals taken for this visit. General Appearance: Well nourished, in no apparent distress. Eyes: PERRLA,  EOMs, conjunctiva no swelling or erythema Sinuses: No Frontal/maxillary tenderness ENT/Mouth: Ext aud canals clear, TMs without erythema, bulging, + EFFUSIONS. No erythema, swelling, or exudate on post pharynx.  Tonsils not swollen or erythematous. Hearing normal.  Neck: Supple, thyroid normal. + carotid tenderness on left without bruit.  Respiratory: Respiratory effort normal, BS equal bilaterally without rales, rhonchi, wheezing or stridor.  Cardio: RRR with no MRGs. Brisk peripheral pulses without edema.  Abdomen: Soft, + BS.  Non tender, no guarding, rebound, hernias, masses. Lymphatics: Non tender without lymphadenopathy.  Musculoskeletal: Full ROM, 5/5 strength, normal gait.  Skin: Warm, dry without rashes, lesions, ecchymosis.  Neuro: Cranial nerves intact. Normal muscle tone, no cerebellar symptoms. Sensation intact.  Psych: Awake and oriented X 3, normal affect, Insight and Judgment appropriate.     Quentin MullingAmanda Maicol Bowland, PA-C 9:42 AM [facility]

## 2016-03-17 NOTE — Patient Instructions (Signed)
Your ears and sinuses are connected by the eustachian tube. When your sinuses are inflamed, this can close off the tube and cause fluid to collect in your middle ear. This can then cause dizziness, popping, clicking, ringing, and echoing in your ears. This is often NOT an infection and does NOT require antibiotics, it is caused by inflammation so the treatments help the inflammation. This can take a long time to get better so please be patient.  Here are things you can do to help with this: - Try the Flonase or Nasonex. Remember to spray each nostril twice towards the outer part of your eye.  Do not sniff but instead pinch your nose and tilt your head back to help the medicine get into your sinuses.  The best time to do this is at bedtime.Stop if you get blurred vision or nose bleeds.  -While drinking fluids, pinch and hold nose close and swallow, to help open eustachian tubes to drain fluid behind ear drums. -Please pick one of the over the counter allergy medications below and take it once daily for allergies.  It will also help with fluid behind ear drums. Claritin or loratadine cheapest but likely the weakest  Zyrtec or certizine at night because it can make you sleepy The strongest is allegra or fexafinadine  Cheapest at [employer], [employer], [employer] -can use decongestant over the counter, please do not use if you have high blood pressure or certain heart conditions.   if worsening HA, changes vision/speech, imbalance, weakness go to the ER   Increase losartan to 100mg  a day  Here is some information to help you keep your heart healthy: Move it! - Aim for 30 mins of activity every day. Take it slowly at first. Talk to us before starting any new exercise program.   Lose it.  -Body Mass Index (BMI) can indicate if you need to lose weight. A healthy range is 18.5-24.9. For a BMI calculator, go to [employer]  Waist Management -Excess abdominal fat is a risk factor for heart disease, diabetes,  asthma, stroke and more. Ideal waist circumference is less than 35" for women and less than 40" for men.   Eat Right -focus on fruits, vegetables, whole grains, and meals you make yourself. Avoid foods with trans fat and high sugar/sodium content.   Snooze or Snore? - Loud snoring can be a sign of sleep apnea, a significant risk factor for high blood pressure, heart attach, stroke, and heart arrhythmias.  Kick the habit -Quit Smoking! Avoid second hand smoke. A single cigarette raises your blood pressure for 20 mins and increases the risk of heart attack and stroke for the next 24 hours.   Are Aspirin and Supplements right for you? -Add ENTERIC COATED low dose 81 mg Aspirin daily OR can do every other day if you have easy bruising to protect your heart and head. As well as to reduce risk of Colon Cancer by 20 %, Skin Cancer by 26 % , Melanoma by 46% and Pancreatic cancer by 60%  Say "No to Stress -There may be little you can do about problems that cause stress. However, techniques such as long walks, meditation, and exercise can help you manage it.   Start Now! - Make changes one at a time and set reasonable goals to increase your likelihood of success.    Monitor your blood pressure at home. Go to the ER if any CP, SOB, nausea, dizziness, severe HA, changes vision/speech  Goal BP:  For patients  younger than 60: Goal BP < 140/90. For patients 60 and older: Goal BP < 150/90. For patients with diabetes: Goal BP < 140/90. Your most recent BP: BP: (!) 140/100 mmHg   Take your medications faithfully as instructed. Maintain a healthy weight. Get at least 150 minutes of aerobic exercise per week. Minimize salt intake. Minimize alcohol intake  DASH Eating Plan DASH stands for "Dietary Approaches to Stop Hypertension." The DASH eating plan is a healthy eating plan that has been shown to reduce high blood pressure (hypertension). Additional health benefits may include reducing the risk of  type 2 diabetes mellitus, heart disease, and stroke. The DASH eating plan may also help with weight loss. WHAT DO I NEED TO KNOW ABOUT THE DASH EATING PLAN? For the DASH eating plan, you will follow these general guidelines:  Choose foods with a percent daily value for sodium of less than 5% (as listed on the food label).  Use salt-free seasonings or herbs instead of table salt or sea salt.  Check with your health care provider or pharmacist before using salt substitutes.  Eat lower-sodium products, often labeled as "lower sodium" or "no salt added."  Eat fresh foods.  Eat more vegetables, fruits, and low-fat dairy products.  Choose whole grains. Look for the word "whole" as the first word in the ingredient list.  Choose fish and skinless chicken or [place] more often than red meat. Limit fish, poultry, and meat to 6 oz (170 g) each day.  Limit sweets, desserts, sugars, and sugary drinks.  Choose heart-healthy fats.  Limit cheese to 1 oz (28 g) per day.  Eat more home-cooked food and less restaurant, buffet, and fast food.  Limit fried foods.  Cook foods using methods other than frying.  Limit canned vegetables. If you do use them, rinse them well to decrease the sodium.  When eating at a restaurant, ask that your food be prepared with less salt, or no salt if possible. WHAT FOODS CAN I EAT? Seek help from a dietitian for individual calorie needs. Grains Whole grain or whole wheat bread. Brown rice. Whole grain or whole wheat pasta. Quinoa, bulgur, and whole grain cereals. Low-sodium cereals. Corn or whole wheat flour tortillas. Whole grain cornbread. Whole grain crackers. Low-sodium crackers. Vegetables Fresh or frozen vegetables (raw, steamed, roasted, or grilled). Low-sodium or reduced-sodium tomato and vegetable juices. Low-sodium or reduced-sodium tomato sauce and paste. Low-sodium or reduced-sodium canned vegetables.  Fruits All fresh, canned (in natural juice), or  frozen fruits. Meat and Other Protein Products Ground beef (85% or leaner), grass-fed beef, or beef trimmed of fat. Skinless chicken or [place]. Ground chicken or [place]. Pork trimmed of fat. All fish and seafood. Eggs. Dried beans, peas, or lentils. Unsalted nuts and seeds. Unsalted canned beans. Dairy Low-fat dairy products, such as skim or 1% milk, 2% or reduced-fat cheeses, low-fat ricotta or cottage cheese, or plain low-fat yogurt. Low-sodium or reduced-sodium cheeses. Fats and Oils Tub margarines without trans fats. Light or reduced-fat mayonnaise and salad dressings (reduced sodium). Avocado. Safflower, olive, or canola oils. Natural peanut or almond butter. Other Unsalted popcorn and pretzels. The items listed above may not be a complete list of recommended foods or beverages. Contact your dietitian for more options. WHAT FOODS ARE NOT RECOMMENDED? Grains White bread. White pasta. White rice. Refined cornbread. Bagels and croissants. Crackers that contain trans fat. Vegetables Creamed or fried vegetables. Vegetables in a cheese sauce. Regular canned vegetables. Regular canned tomato sauce and paste. Regular tomato and  vegetable juices. Fruits Dried fruits. Canned fruit in light or heavy syrup. Fruit juice. Meat and Other Protein Products Fatty cuts of meat. Ribs, chicken wings, bacon, sausage, bologna, salami, chitterlings, fatback, hot dogs, bratwurst, and packaged luncheon meats. Salted nuts and seeds. Canned beans with salt. Dairy Whole or 2% milk, cream, half-and-half, and cream cheese. Whole-fat or sweetened yogurt. Full-fat cheeses or blue cheese. Nondairy creamers and whipped toppings. Processed cheese, cheese spreads, or cheese curds. Condiments Onion and garlic salt, seasoned salt, table salt, and sea salt. Canned and packaged gravies. Worcestershire sauce. Tartar sauce. Barbecue sauce. Teriyaki sauce. Soy sauce, including reduced sodium. Steak sauce. Fish sauce. Oyster sauce.  Cocktail sauce. Horseradish. Ketchup and mustard. Meat flavorings and tenderizers. Bouillon cubes. Hot sauce. Tabasco sauce. Marinades. Taco seasonings. Relishes. Fats and Oils Butter, stick margarine, lard, shortening, ghee, and bacon fat. Coconut, palm kernel, or palm oils. Regular salad dressings. Other Pickles and olives. Salted popcorn and pretzels. The items listed above may not be a complete list of foods and beverages to avoid. Contact your dietitian for more information. WHERE CAN I FIND MORE INFORMATION? National Heart, Lung, and Blood Institute: [employer] Document Released: 10/30/2011 Document Revised: 03/27/2014 Document Reviewed: 09/14/2013 [facility] Patient Information 2015 [place], [place]. This information is not intended to replace advice given to you by your health care provider. Make sure you discuss any questions you have with your health care provider.

## 2016-03-26 ENCOUNTER — Encounter: Payer: Self-pay | Admitting: Physician Assistant

## 2016-04-04 ENCOUNTER — Other Ambulatory Visit: Payer: Self-pay | Admitting: Internal Medicine

## 2016-04-15 ENCOUNTER — Encounter: Payer: Self-pay | Admitting: Physician Assistant

## 2016-05-23 ENCOUNTER — Ambulatory Visit (INDEPENDENT_AMBULATORY_CARE_PROVIDER_SITE_OTHER): Payer: 59 | Admitting: Physician Assistant

## 2016-05-23 ENCOUNTER — Encounter: Payer: Self-pay | Admitting: Physician Assistant

## 2016-05-23 VITALS — BP 140/90 | HR 75 | Temp 98.1°F | Resp 16 | Ht 72.0 in | Wt 261.8 lb

## 2016-05-23 DIAGNOSIS — E785 Hyperlipidemia, unspecified: Secondary | ICD-10-CM

## 2016-05-23 DIAGNOSIS — E669 Obesity, unspecified: Secondary | ICD-10-CM

## 2016-05-23 DIAGNOSIS — I10 Essential (primary) hypertension: Secondary | ICD-10-CM | POA: Diagnosis not present

## 2016-05-23 MED ORDER — BUPROPION HCL ER (XL) 150 MG PO TB24: 150.0000 mg | ORAL_TABLET | ORAL | Status: DC

## 2016-05-23 MED ORDER — BISOPROLOL-HYDROCHLOROTHIAZIDE 5-6.25 MG PO TABS: 1.0000 | ORAL_TABLET | Freq: Every day | ORAL | Status: DC

## 2016-05-23 MED ORDER — ALPRAZOLAM 0.5 MG PO TABS: 0.5000 mg | ORAL_TABLET | Freq: Two times a day (BID) | ORAL | Status: DC | PRN

## 2016-05-23 NOTE — Progress Notes (Signed)
Assessment and Plan:   Essential hypertension Get back on ziac 5mg  Continue weight loss  Obesity (BMI 30-39.9) Obesity with co morbidities- long discussion about weight loss, diet, and exercise   Prediabetes Check next ov, decrease carbs   Hyperlipidemia -check lipids next OV, decrease fatty foods, increase activity.     Continue diet and meds as discussed. Further disposition pending results of labs. Over 30 minutes of exam, counseling, chart review, and critical decision making was performed   HPI 35 y.o. male  presents for medication review, weight gain, and anxiety.   His blood pressure has not been checked at home,  today their BP is BP: 140/90 mmHg  He does workout. He denies chest pain, shortness of breath, dizziness.  He is not on cholesterol medication and denies myalgias. His cholesterol is at goal. The cholesterol last visit was:   Lab Results  Component Value Date   CHOL 184 02/05/2016   HDL 35* 02/05/2016   LDLCALC 130* 02/05/2016   TRIG 97 02/05/2016   CHOLHDL 5.3* 02/05/2016   Lab Results  Component Value Date   TSH 1.77 02/05/2016    He has been working on diet and exercise for prediabetes, and denies paresthesia of the feet, polydipsia, polyuria and visual disturbances. Last A1C in the office was:  Lab Results  Component Value Date   HGBA1C 5.7* 02/05/2016   Patient is on Vitamin D supplement.   Lab Results  Component Value Date   VD25OH 55 02/05/2016      BMI is Body mass index is 35.5 kg/(m^2)., he is working on diet and exercise. Wt Readings from Last 3 Encounters:  05/23/16 261 lb 12.8 oz (118.752 kg)  03/17/16 271 lb 12.8 oz (123.288 kg)  02/18/16 262 lb 3.2 oz (118.933 kg)     Current Medications:  Current Outpatient Prescriptions on File Prior to Visit  Medication Sig Dispense Refill  . ALPRAZolam (XANAX) 0.5 MG tablet Take 1 tablet (0.5 mg total) by mouth 2 (two) times daily as needed for anxiety or sleep. 30 tablet 0  . citalopram  (CELEXA) 40 MG tablet Take 1 tablet (40 mg total) by mouth daily. 90 tablet 2  . losartan (COZAAR) 100 MG tablet     . bisoprolol-hydrochlorothiazide (ZIAC) 10-6.25 MG tablet TAKE 1 TABLET BY MOUTH DAILY. (Patient not taking: Reported on 05/23/2016) 30 tablet 0   No current facility-administered medications on file prior to visit.   Medical History:  Past Medical History  Diagnosis Date  . Hypertension   . Hyperlipidemia   . Prediabetes   . Anxiety   . B12 deficiency   . Vitamin D deficiency    Allergies: No Known Allergies   Review of Systems:  Review of Systems  Constitutional: Negative.  Negative for weight loss and malaise/fatigue.  HENT: Negative.   Eyes: Negative.   Respiratory: Negative.   Cardiovascular: Negative for chest pain, palpitations, orthopnea, claudication, leg swelling and PND.  Gastrointestinal: Negative for heartburn, nausea, vomiting, abdominal pain, diarrhea, constipation, blood in stool and melena.  Genitourinary: Negative.   Musculoskeletal: Negative for myalgias, back pain, joint pain, falls and neck pain.  Skin: Negative for itching and rash.  Neurological: Negative.  Negative for weakness.  Psychiatric/Behavioral: Negative for depression, suicidal ideas, hallucinations, memory loss and substance abuse. The patient is nervous/anxious. The patient does not have insomnia.     Family history- Review and unchanged Social history- Review and unchanged Physical Exam: BP 140/90 mmHg  Pulse 75  Temp(Src) 98.1  F (36.7 C) (Temporal)  Resp 16  Ht 6' (1.829 m)  Wt 261 lb 12.8 oz (118.752 kg)  BMI 35.50 kg/m2  SpO2 96% Wt Readings from Last 3 Encounters:  05/23/16 261 lb 12.8 oz (118.752 kg)  03/17/16 271 lb 12.8 oz (123.288 kg)  02/18/16 262 lb 3.2 oz (118.933 kg)   General Appearance: Well nourished, in no apparent distress. Eyes: PERRLA, EOMs, conjunctiva no swelling or erythema Sinuses: No Frontal/maxillary tenderness ENT/Mouth: Ext aud canals  clear, TMs without erythema, bulging. No erythema, swelling, or exudate on post pharynx.  Tonsils not swollen or erythematous. Hearing normal.  Neck: Supple, thyroid normal.  Respiratory: Respiratory effort normal, BS equal bilaterally without rales, rhonchi, wheezing or stridor.  Cardio: RRR with no MRGs. Brisk peripheral pulses without edema.  Abdomen: Soft, + BS,  non tender, no guarding, rebound, hernias, masses. Lymphatics: Non tender without lymphadenopathy.  Musculoskeletal: Full ROM, 5/5 strength, Normal gait.  Neuro: Cranial nerves intact. Normal muscle tone, no cerebellar symptoms. Psych: Awake and oriented X 3, normal affect, Insight and Judgment appropriate.    Quentin MullingAmanda Kaelem Brach, PA-C 12:03 PM [facility]

## 2016-05-23 NOTE — Patient Instructions (Signed)
Cut the celexa in half Start the wellbutrin 150 XL once in the morning for anxiety  Continue ziac/bispropolol 5mg  and losartan 100mg   Continue weight loss

## 2016-07-12 ENCOUNTER — Other Ambulatory Visit: Payer: Self-pay | Admitting: Internal Medicine

## 2016-08-25 ENCOUNTER — Ambulatory Visit (INDEPENDENT_AMBULATORY_CARE_PROVIDER_SITE_OTHER): Payer: 59 | Admitting: Physician Assistant

## 2016-08-25 ENCOUNTER — Encounter: Payer: Self-pay | Admitting: Physician Assistant

## 2016-08-25 VITALS — BP 140/100 | HR 80 | Temp 97.5°F | Resp 16 | Wt 268.0 lb

## 2016-08-25 DIAGNOSIS — Z0001 Encounter for general adult medical examination with abnormal findings: Secondary | ICD-10-CM

## 2016-08-25 DIAGNOSIS — F411 Generalized anxiety disorder: Secondary | ICD-10-CM

## 2016-08-25 DIAGNOSIS — E559 Vitamin D deficiency, unspecified: Secondary | ICD-10-CM

## 2016-08-25 DIAGNOSIS — R7303 Prediabetes: Secondary | ICD-10-CM

## 2016-08-25 DIAGNOSIS — E291 Testicular hypofunction: Secondary | ICD-10-CM

## 2016-08-25 DIAGNOSIS — M545 Low back pain: Secondary | ICD-10-CM

## 2016-08-25 DIAGNOSIS — Z136 Encounter for screening for cardiovascular disorders: Secondary | ICD-10-CM

## 2016-08-25 DIAGNOSIS — I10 Essential (primary) hypertension: Secondary | ICD-10-CM

## 2016-08-25 DIAGNOSIS — Z79899 Other long term (current) drug therapy: Secondary | ICD-10-CM

## 2016-08-25 DIAGNOSIS — E669 Obesity, unspecified: Secondary | ICD-10-CM

## 2016-08-25 DIAGNOSIS — E785 Hyperlipidemia, unspecified: Secondary | ICD-10-CM

## 2016-08-25 DIAGNOSIS — Z0000 Encounter for general adult medical examination without abnormal findings: Secondary | ICD-10-CM

## 2016-08-25 DIAGNOSIS — E538 Deficiency of other specified B group vitamins: Secondary | ICD-10-CM

## 2016-08-25 LAB — BASIC METABOLIC PANEL WITH GFR
BUN: 12 mg/dL (ref 7–25)
CALCIUM: 9.2 mg/dL (ref 8.6–10.3)
CHLORIDE: 103 mmol/L (ref 98–110)
CO2: 24 mmol/L (ref 20–31)
Creat: 1.06 mg/dL (ref 0.60–1.35)
GFR, Est African American: >89 mL/min (ref >=60)
GFR, Est Non African American: >89 mL/min (ref >=60)
Glucose, Bld: 95 mg/dL (ref 65–99)
POTASSIUM: 4.4 mmol/L (ref 3.5–5.3)
SODIUM: 137 mmol/L (ref 135–146)

## 2016-08-25 LAB — CBC WITH DIFFERENTIAL/PLATELET
BASOS ABS: 44 {cells}/uL (ref 0–200)
BASOS PCT: 1 %
EOS ABS: 88 {cells}/uL (ref 15–500)
Eosinophils Relative: 2 %
HCT: 44.2 % (ref 38.5–50.0)
HEMOGLOBIN: 14.9 g/dL (ref 13.2–17.1)
LYMPHS ABS: 1540 {cells}/uL (ref 850–3900)
Lymphocytes Relative: 35 %
MCH: 30.3 pg (ref 27.0–33.0)
MCHC: 33.7 g/dL (ref 32.0–36.0)
MCV: 89.8 fL (ref 80.0–100.0)
MONOS PCT: 14 %
MPV: 11.4 fL (ref 7.5–12.5)
Monocytes Absolute: 616 cells/uL (ref 200–950)
NEUTROS ABS: 2112 {cells}/uL (ref 1500–7800)
Neutrophils Relative %: 48 %
PLATELETS: 236 10*3/uL (ref 140–400)
RBC: 4.92 MIL/uL (ref 4.20–5.80)
RDW: 13.5 % (ref 11.0–15.0)
WBC: 4.4 10*3/uL (ref 3.8–10.8)

## 2016-08-25 LAB — LIPID PANEL
CHOL/HDL RATIO: 5.1 ratio — AB (ref <=5.0)
CHOLESTEROL: 208 mg/dL — AB (ref 125–200)
HDL: 41 mg/dL (ref >=40)
LDL Cholesterol: 116 mg/dL (ref <130)
Triglycerides: 255 mg/dL — ABNORMAL HIGH (ref <150)
VLDL: 51 mg/dL — AB (ref <30)

## 2016-08-25 LAB — MAGNESIUM: Magnesium: 1.7 mg/dL (ref 1.5–2.5)

## 2016-08-25 LAB — HEPATIC FUNCTION PANEL
ALT: 33 U/L (ref 9–46)
AST: 27 U/L (ref 10–40)
Albumin: 4.3 g/dL (ref 3.6–5.1)
Alkaline Phosphatase: 64 U/L (ref 40–115)
BILIRUBIN DIRECT: 0.1 mg/dL (ref <=0.2)
BILIRUBIN INDIRECT: 0.5 mg/dL (ref 0.2–1.2)
BILIRUBIN TOTAL: 0.6 mg/dL (ref 0.2–1.2)
Total Protein: 6.6 g/dL (ref 6.1–8.1)

## 2016-08-25 LAB — TSH: TSH: 0.93 mIU/L (ref 0.40–4.50)

## 2016-08-25 LAB — VITAMIN B12: VITAMIN B 12: 359 pg/mL (ref 200–1100)

## 2016-08-25 NOTE — Patient Instructions (Addendum)
Take the ziac and the losartan Continue the celexa but can cut it in half  Heat, stretches, take flexeril, if not better can refer to ortho or PT  Food Choices for Gastroesophageal Reflux Disease, Adult When you have gastroesophageal reflux disease (GERD), the foods you eat and your eating habits are very important. Choosing the right foods can help ease the discomfort of GERD. WHAT GENERAL GUIDELINES DO I NEED TO FOLLOW?  Choose fruits, vegetables, whole grains, low-fat dairy products, and low-fat meat, fish, and poultry.  Limit fats such as oils, salad dressings, butter, nuts, and avocado.  Keep a food diary to identify foods that cause symptoms.  Avoid foods that cause reflux. These may be different for different people.  Eat frequent small meals instead of three large meals each day.  Eat your meals slowly, in a relaxed setting.  Limit fried foods.  Cook foods using methods other than frying.  Avoid drinking alcohol.  Avoid drinking large amounts of liquids with your meals.  Avoid bending over or lying down until 2-3 hours after eating. WHAT FOODS ARE NOT RECOMMENDED? The following are some foods and drinks that may worsen your symptoms: Vegetables Tomatoes. Tomato juice. Tomato and spaghetti sauce. Chili peppers. Onion and garlic. Horseradish. Fruits Oranges, grapefruit, and lemon (fruit and juice). Meats High-fat meats, fish, and poultry. This includes hot dogs, ribs, ham, sausage, salami, and bacon. Dairy Whole milk and chocolate milk. Sour cream. Cream. Butter. Ice cream. Cream cheese.  Beverages Coffee and tea, with or without caffeine. Carbonated beverages or energy drinks. Condiments Hot sauce. Barbecue sauce.  Sweets/Desserts Chocolate and cocoa. Donuts. Peppermint and spearmint. Fats and Oils High-fat foods, including [place] fries and potato chips. Other Vinegar. Strong spices, such as black pepper, white pepper, red pepper, cayenne, curry powder, cloves,  ginger, and chili powder. The items listed above may not be a complete list of foods and beverages to avoid. Contact your dietitian for more information.   This information is not intended to replace advice given to you by your health care provider. Make sure you discuss any questions you have with your health care provider.   Document Released: 11/10/2005 Document Revised: 12/01/2014 Document Reviewed: 09/14/2013 Elsevier Interactive Patient Education 2016 [employer]  B12 is low end of normal, add sublingual B12. The sublingual is better than the pills because likely you are not absorbing in your intestines well so the sublingual gets absorbed through your mouth and ensures you get the amount you need. Will help with energy, memory/concentration, decrease nerve pain, and help with weight loss. B12 is water soluble vitamin so you can not over dose on it, and anything you do not use will be sent out in your urine.

## 2016-08-25 NOTE — Progress Notes (Signed)
Complete Physical  Assessment and Plan: Essential hypertension - get on cozaar/meds - continue medications, DASH diet, exercise and monitor at home. Call if greater than 130/80. - CBC with Differential/Platelet - BASIC METABOLIC PANEL WITH GFR - Hepatic function panel - TSH - Urinalysis, Routine w reflex microscopic - Microalbumin / creatinine urine ratio - EKG 12-Lead  Prediabetes Discussed general issues about diabetes pathophysiology and management., Educational material distributed., Suggested low cholesterol diet., Encouraged aerobic exercise., Discussed foot care., Reminded to get yearly retinal exam. - Hemoglobin A1c - Insulin, fasting  Obesity (BMI 30-39.9) -Obesity with co morbidities- long discussion about weight loss, diet, and exercise - Testosterone  Hyperlipidemia -continue medications, check lipids, decrease fatty foods, increase activity.  - Lipid panel  Encounter for long-term (current) use of medications - Magnesium  Vitamin D deficiency - Vit D  25 hydroxy (rtn osteoporosis monitoring)  Generalized anxiety disorder - continue celexa, stress management techniques discussed, increase water, good sleep hygiene discussed, increase exercise, and increase veggies.    Routine general medical examination at a health care facility  B12 deficiency - Vitamin B12   Discussed med's effects and SE's. Screening labs and tests as requested with regular follow-up as recommended. Over 40 minutes of exam, counseling, chart review and critical decision making was performed  HPI Patient presents for a complete physical.   His blood pressure has not been checked at home, he has been off the losartan and has been taking two of the ziac which makes him feel bad, today their BP is BP: (!) 140/100 He does workout. He denies chest pain, shortness of breath, dizziness.  He is not on cholesterol medication, he is on bASA and denies myalgias. His cholesterol is not at goal. The  cholesterol last visit was:   Lab Results  Component Value Date   CHOL 184 02/05/2016   HDL 35 (L) 02/05/2016   LDLCALC 130 (H) 02/05/2016   TRIG 97 02/05/2016   CHOLHDL 5.3 (H) 02/05/2016   He has been working on diet and exercise for prediabetes,  and denies paresthesia of the feet, polydipsia, polyuria and visual disturbances. Last A1C in the office was:  Lab Results  Component Value Date   HGBA1C 5.7 (H) 02/05/2016   Patient is on Vitamin D supplement.   Lab Results  Component Value Date   VD25OH 55 02/05/2016     BMI is Body mass index is 36.35 kg/m., he is working on diet and exercise. 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 But still feels very angry and have a lot of anxiety.   B12 last year showed B12 of 302, has been taking MVIT with B12 in it.    Current Medications:  Current Outpatient Prescriptions on File Prior to Visit  Medication Sig Dispense Refill  . ALPRAZolam (XANAX) 0.5 MG tablet Take 1 tablet (0.5 mg total) by mouth 2 (two) times daily as needed for anxiety or sleep. 60 tablet 0  . bisoprolol-hydrochlorothiazide (ZIAC) 5-6.25 MG tablet Take 1 tablet by mouth daily. 30 tablet 3  . buPROPion (WELLBUTRIN XL) 150 MG 24 hr tablet Take 1 tablet (150 mg total) by mouth every morning. 30 tablet 2  . citalopram (CELEXA) 40 MG tablet TAKE 1 TABLET (40 MG TOTAL)  BY MOUTH DAILY. 90 tablet 0  . losartan (COZAAR) 100 MG tablet      No current facility-administered medications on file prior to visit.    Health Maintenance:   There is no immunization history on file for this patient.  Tetanus: 6 years ago Pneumovax: Prevnar 13: Flu vaccine: Zostavax: DEXA: Colonoscopy: EGD: Eye Exam: Dentist:  Patient Care Team: Lucky CowboyWilliam McKeown, MD  as PCP - General (Internal Medicine)  Allergies: No Known Allergies Medical History:  Past Medical History:  Diagnosis Date  . Anxiety   . B12 deficiency   . Hyperlipidemia   . Hypertension   . Prediabetes   . Vitamin D deficiency    Surgical History: No past surgical history on file. Family History:  Family History  Problem Relation Age of Onset  . Hypertension Father    Social History:   Social History  Substance Use Topics  . Smoking status: Never Smoker  . Smokeless tobacco: Never Used  . Alcohol use Not on file     Comment: social   Review of Systems:  Review of Systems  Constitutional: Positive for malaise/fatigue. Negative for chills, diaphoresis, fever and weight loss.       Weight gain  HENT: Negative for congestion, ear discharge, ear pain, hearing loss, nosebleeds, sore throat and tinnitus.   Eyes: Negative.   Respiratory: Negative.  Negative for stridor.   Cardiovascular: Positive for chest pain (with anxiety) and palpitations. Negative for orthopnea, claudication, leg swelling and PND.  Gastrointestinal: Negative.   Genitourinary: Negative.   Musculoskeletal: Positive for neck pain (with sneezing). Negative for back pain, falls, joint pain and myalgias.  Skin: Negative.   Neurological: Positive for headaches. Negative for weakness.  Endo/Heme/Allergies: Negative.   Psychiatric/Behavioral: Negative for depression, hallucinations, memory loss, substance abuse and suicidal ideas. The patient is nervous/anxious (with "anger issues" per wife). The patient does not have insomnia.     Physical Exam: Estimated body mass index is 36.35 kg/m as calculated from the following:   Height as of 05/23/16: 6' (1.829 m).   Weight as of this encounter: 268 lb (121.6 kg). BP (!) 140/100   Pulse 80   Temp 97.5 F (36.4 C)   Resp 16   Wt 268 lb (121.6 kg)   SpO2 96%   BMI 36.35 kg/m  General Appearance: Well nourished, in no apparent distress.  Eyes: PERRLA, EOMs,  conjunctiva no swelling or erythema, normal fundi and vessels.  Sinuses: No Frontal/maxillary tenderness  ENT/Mouth: Ext aud canals clear, normal light reflex with TMs without erythema, bulging. Good dentition. No erythema, swelling, or exudate on post pharynx. Tonsils not swollen or erythematous. Hearing normal. Nose deviated to the left with septum deviation.  Neck: Supple, thyroid normal. No bruits  Respiratory: Respiratory effort normal, BS equal bilaterally without rales, rhonchi, wheezing or stridor.  Cardio: RRR without murmurs, rubs or gallops. Brisk peripheral pulses without edema.  Chest: symmetric, with normal excursions and percussion.  Abdomen: Soft, nontender, no guarding, rebound, hernias, masses, or organomegaly.  Lymphatics: Non tender without lymphadenopathy.  Genitourinary:  Musculoskeletal: Full ROM all peripheral extremities,5/5 strength, and normal gait.  Skin: Warm, dry without rashes, lesions, ecchymosis. Neuro: Cranial nerves intact, reflexes equal bilaterally. Normal muscle tone, no cerebellar symptoms. Sensation intact.  Psych: Awake and oriented X 3, normal affect, Insight and Judgment appropriate.   EKG: WNL no changes.  Quentin MullingAmanda Naksh Radi 10:22 AM [facility]

## 2016-08-26 LAB — URINALYSIS, ROUTINE W REFLEX MICROSCOPIC
Bilirubin Urine: NEGATIVE
Glucose, UA: NEGATIVE
Hgb urine dipstick: NEGATIVE
KETONES UR: NEGATIVE
LEUKOCYTES UA: NEGATIVE
NITRITE: NEGATIVE
PH: 7 (ref 5.0–8.0)
Protein, ur: NEGATIVE
SPECIFIC GRAVITY, URINE: 1.025 (ref 1.001–1.035)

## 2016-08-26 LAB — HEMOGLOBIN A1C
Hgb A1c MFr Bld: 5.5 % (ref <5.7)
MEAN PLASMA GLUCOSE: 111 mg/dL

## 2016-08-26 LAB — TESTOSTERONE: Testosterone: 586 ng/dL (ref 250–827)

## 2016-08-26 LAB — MICROALBUMIN / CREATININE URINE RATIO
CREATININE, URINE: 233 mg/dL (ref 20–370)
MICROALB UR: 0.7 mg/dL
MICROALB/CREAT RATIO: 3 ug/mg{creat} (ref <30)

## 2016-08-26 LAB — VITAMIN D 25 HYDROXY (VIT D DEFICIENCY, FRACTURES): Vit D, 25-Hydroxy: 41 ng/mL (ref 30–100)

## 2016-09-24 ENCOUNTER — Encounter: Payer: Self-pay | Admitting: *Deleted

## 2016-09-26 ENCOUNTER — Other Ambulatory Visit: Payer: Self-pay | Admitting: Physician Assistant

## 2016-10-16 ENCOUNTER — Other Ambulatory Visit: Payer: Self-pay | Admitting: Internal Medicine

## 2016-12-01 ENCOUNTER — Ambulatory Visit: Payer: Self-pay | Admitting: Physician Assistant

## 2017-01-03 ENCOUNTER — Other Ambulatory Visit: Payer: Self-pay | Admitting: Physician Assistant

## 2017-02-06 ENCOUNTER — Other Ambulatory Visit: Payer: Self-pay | Admitting: Internal Medicine

## 2017-03-05 ENCOUNTER — Encounter: Payer: Self-pay | Admitting: Physician Assistant

## 2017-03-05 ENCOUNTER — Ambulatory Visit (INDEPENDENT_AMBULATORY_CARE_PROVIDER_SITE_OTHER): Payer: 59 | Admitting: Physician Assistant

## 2017-03-05 VITALS — BP 138/80 | HR 73 | Temp 98.1°F | Resp 14 | Ht 72.0 in | Wt 264.0 lb

## 2017-03-05 DIAGNOSIS — M545 Low back pain, unspecified: Secondary | ICD-10-CM

## 2017-03-05 DIAGNOSIS — F419 Anxiety disorder, unspecified: Secondary | ICD-10-CM

## 2017-03-05 DIAGNOSIS — E785 Hyperlipidemia, unspecified: Secondary | ICD-10-CM | POA: Diagnosis not present

## 2017-03-05 DIAGNOSIS — E669 Obesity, unspecified: Secondary | ICD-10-CM

## 2017-03-05 DIAGNOSIS — I10 Essential (primary) hypertension: Secondary | ICD-10-CM

## 2017-03-05 DIAGNOSIS — R7303 Prediabetes: Secondary | ICD-10-CM | POA: Diagnosis not present

## 2017-03-05 DIAGNOSIS — E559 Vitamin D deficiency, unspecified: Secondary | ICD-10-CM | POA: Diagnosis not present

## 2017-03-05 LAB — CBC WITH DIFFERENTIAL/PLATELET
BASOS ABS: 0 {cells}/uL (ref 0–200)
Basophils Relative: 0 %
EOS PCT: 1 %
Eosinophils Absolute: 43 cells/uL (ref 15–500)
HCT: 44.1 % (ref 38.5–50.0)
Hemoglobin: 14.8 g/dL (ref 13.2–17.1)
LYMPHS ABS: 1548 {cells}/uL (ref 850–3900)
Lymphocytes Relative: 36 %
MCH: 30.6 pg (ref 27.0–33.0)
MCHC: 33.6 g/dL (ref 32.0–36.0)
MCV: 91.1 fL (ref 80.0–100.0)
MONOS PCT: 13 %
MPV: 10.6 fL (ref 7.5–12.5)
Monocytes Absolute: 559 cells/uL (ref 200–950)
NEUTROS ABS: 2150 {cells}/uL (ref 1500–7800)
Neutrophils Relative %: 50 %
PLATELETS: 216 10*3/uL (ref 140–400)
RBC: 4.84 MIL/uL (ref 4.20–5.80)
RDW: 13.6 % (ref 11.0–15.0)
WBC: 4.3 10*3/uL (ref 3.8–10.8)

## 2017-03-05 LAB — LIPID PANEL
CHOL/HDL RATIO: 4.3 ratio (ref <5.0)
Cholesterol: 190 mg/dL (ref <200)
HDL: 44 mg/dL (ref >40)
LDL CALC: 127 mg/dL — AB (ref <100)
Triglycerides: 97 mg/dL (ref <150)
VLDL: 19 mg/dL (ref <30)

## 2017-03-05 LAB — BASIC METABOLIC PANEL WITH GFR
BUN: 15 mg/dL (ref 7–25)
CALCIUM: 9.5 mg/dL (ref 8.6–10.3)
CHLORIDE: 102 mmol/L (ref 98–110)
CO2: 28 mmol/L (ref 20–31)
CREATININE: 1.03 mg/dL (ref 0.60–1.35)
GFR, Est African American: >89 mL/min (ref >=60)
Glucose, Bld: 84 mg/dL (ref 65–99)
Potassium: 4.5 mmol/L (ref 3.5–5.3)
SODIUM: 139 mmol/L (ref 135–146)

## 2017-03-05 LAB — HEPATIC FUNCTION PANEL
ALT: 39 U/L (ref 9–46)
AST: 30 U/L (ref 10–40)
Albumin: 4.4 g/dL (ref 3.6–5.1)
Alkaline Phosphatase: 53 U/L (ref 40–115)
BILIRUBIN DIRECT: 0.2 mg/dL (ref <=0.2)
Indirect Bilirubin: 0.7 mg/dL (ref 0.2–1.2)
Total Bilirubin: 0.9 mg/dL (ref 0.2–1.2)
Total Protein: 7 g/dL (ref 6.1–8.1)

## 2017-03-05 LAB — TSH: TSH: 1.27 m[IU]/L (ref 0.40–4.50)

## 2017-03-05 MED ORDER — ALPRAZOLAM 0.5 MG PO TABS: 0.5000 mg | ORAL_TABLET | Freq: Two times a day (BID) | ORAL | 0 refills | Status: DC | PRN

## 2017-03-05 MED ORDER — MELOXICAM 15 MG PO TABS: ORAL_TABLET | ORAL | 1 refills | Status: DC

## 2017-03-05 MED ORDER — CITALOPRAM HYDROBROMIDE 40 MG PO TABS: ORAL_TABLET | ORAL | 1 refills | Status: DC

## 2017-03-05 NOTE — Progress Notes (Signed)
Assessment and Plan:   Essential hypertension - continue medications, DASH diet, exercise and monitor at home. Call if greater than 130/80.  -     CBC with Differential/Platelet -     BASIC METABOLIC PANEL WITH GFR -     Hepatic function panel -     TSH  Morbid Obesity with co morbidities - long discussion about weight loss, diet, and exercise  Hyperlipidemia, unspecified hyperlipidemia type -     Lipid panel  Anxiety -     citalopram (CELEXA) 40 MG tablet; Take 1 tablet daily for Mood -     ALPRAZolam (XANAX) 0.5 MG tablet; Take 1 tablet (0.5 mg total) by mouth 2 (two) times daily as needed for anxiety or sleep.  Acute left-sided low back pain without sciatica  mobic x 2 weeks, stretches, if not better will refer to ortho, no red flags -     meloxicam (MOBIC) 15 MG tablet; Take one daily with food for 2 weeks, can take with tylenol, can not take with aleve, iburpofen, then as needed daily for pain  Continue diet and meds as discussed. Further disposition pending results of labs. Over 30 minutes of exam, counseling, chart review, and critical decision making was performed Future Appointments Date Time Provider Department Center  08/31/2017 10:00 AM Quentin Mulling, PA-C [id number] None      HPI 36 y.o. male  presents for medication review, weight gain, and anxiety.   His blood pressure has not been checked at home,  today their BP is BP: 138/80  He does workout. He denies chest pain, shortness of breath, dizziness.  He is not on cholesterol medication and denies myalgias. His cholesterol is at goal. The cholesterol last visit was:   Lab Results  Component Value Date   CHOL 208 (H) 08/25/2016   HDL 41 08/25/2016   LDLCALC 116 08/25/2016   TRIG 255 (H) 08/25/2016   CHOLHDL 5.1 (H) 08/25/2016   Lab Results  Component Value Date   TSH 0.93 08/25/2016   He is traveling a lot for his job, divorce is final, trying to move to [place], has had lower back pain with traveling,  bilateral but worse left lower back no radicular symptoms, Patient denies fever, hematuria, incontinence, numbness, tingling, weakness and saddle anesthesia  He has been working on diet and exercise for prediabetes, and denies paresthesia of the feet, polydipsia, polyuria and visual disturbances. Last A1C in the office was:  Lab Results  Component Value Date   HGBA1C 5.5 08/25/2016   Patient is on Vitamin D supplement.   Lab Results  Component Value Date   VD25OH 41 08/25/2016      BMI is Body mass index is 35.8 kg/m., he is working on diet and exercise. Wt Readings from Last 3 Encounters:  03/05/17 264 lb (119.7 kg)  08/25/16 268 lb (121.6 kg)  05/23/16 261 lb 12.8 oz (118.8 kg)     Current Medications:  Current Outpatient Prescriptions on File Prior to Visit  Medication Sig Dispense Refill  . ALPRAZolam (XANAX) 0.5 MG tablet Take 1 tablet (0.5 mg total) by mouth 2 (two) times daily as needed for anxiety or sleep. 60 tablet 0  . bisoprolol-hydrochlorothiazide (ZIAC) 5-6.25 MG tablet Take 1 tablet daily for BP - Need Office Visit before further refills 30 tablet 0  . citalopram (CELEXA) 40 MG tablet Take 1 tablet daily for Mood - Need Office visit before further refills 30 tablet 0  . losartan (COZAAR) 100 MG tablet  TAKE 1 TABLET DAILY FOR BP - NEEDS OFFICE VISIT BEFORE NEXT REFILL 10 tablet 0   No current facility-administered medications on file prior to visit.    Medical History:  Past Medical History:  Diagnosis Date  . Anxiety   . B12 deficiency   . Hyperlipidemia   . Hypertension   . Prediabetes   . Vitamin D deficiency    Allergies: No Known Allergies   Review of Systems:  Review of Systems  Constitutional: Negative.  Negative for malaise/fatigue and weight loss.  HENT: Negative.   Eyes: Negative.   Respiratory: Negative.   Cardiovascular: Negative for chest pain, palpitations, orthopnea, claudication, leg swelling and PND.  Gastrointestinal: Negative for  abdominal pain, blood in stool, constipation, diarrhea, heartburn, melena, nausea and vomiting.  Genitourinary: Negative.   Musculoskeletal: Positive for back pain. Negative for falls, joint pain, myalgias and neck pain.  Skin: Negative for itching and rash.  Neurological: Negative.  Negative for weakness.  Psychiatric/Behavioral: Negative for depression, hallucinations, memory loss, substance abuse and suicidal ideas. The patient is nervous/anxious. The patient does not have insomnia.     Family history- Review and unchanged Social history- Review and unchanged Physical Exam: BP 138/80   Pulse 73   Temp 98.1 F (36.7 C)   Resp 14   Ht 6' (1.829 m)   Wt 264 lb (119.7 kg)   SpO2 95%   BMI 35.80 kg/m  Wt Readings from Last 3 Encounters:  03/05/17 264 lb (119.7 kg)  08/25/16 268 lb (121.6 kg)  05/23/16 261 lb 12.8 oz (118.8 kg)   General Appearance: Well nourished, in no apparent distress. Eyes: PERRLA, EOMs, conjunctiva no swelling or erythema Sinuses: No Frontal/maxillary tenderness ENT/Mouth: Ext aud canals clear, TMs without erythema, bulging. No erythema, swelling, or exudate on post pharynx.  Tonsils not swollen or erythematous. Hearing normal.  Neck: Supple, thyroid normal.  Respiratory: Respiratory effort normal, BS equal bilaterally without rales, rhonchi, wheezing or stridor.  Cardio: RRR with no MRGs. Brisk peripheral pulses without edema.  Abdomen: Soft, + BS,  non tender, no guarding, rebound, hernias, masses. Lymphatics: Non tender without lymphadenopathy.  Musculoskeletal: Full ROM, 5/5 strength, Normal gait. Straight leg raising with dorsiflexion negative bilaterally for radicular symptoms. Sensory exam in the legs are normal. Knee reflexes are normal Ankle reflexes are normal Strength is normal and symmetric in arms and legs. There is not SI tenderness to palpation.  There is left paraspinal muscle spasm.  There is not midline tenderness.  ROM of spine not  limited Neuro: Cranial nerves intact. Normal muscle tone, no cerebellar symptoms. Psych: Awake and oriented X 3, normal affect, Insight and Judgment appropriate.    Quentin Mulling, PA-C 9:14 AM [facility]

## 2017-03-05 NOTE — Patient Instructions (Signed)
Do the mobic once daily for 2 weeks with food Do stretches, add on the one we did in the office   Back Exercises The following exercises strengthen the muscles that help to support the back. They also help to keep the lower back flexible. Doing these exercises can help to prevent back pain or lessen existing pain. If you have back pain or discomfort, try doing these exercises 2-3 times each day or as told by your health care provider. When the pain goes away, do them once each day, but increase the number of times that you repeat the steps for each exercise (do more repetitions). If you do not have back pain or discomfort, do these exercises once each day or as told by your health care provider. Exercises Single Knee to Chest   Repeat these steps 3-5 times for each leg: 1. Lie on your back on a firm bed or the floor with your legs extended. 2. Bring one knee to your chest. Your other leg should stay extended and in contact with the floor. 3. Hold your knee in place by grabbing your knee or thigh. 4. Pull on your knee until you feel a gentle stretch in your lower back. 5. Hold the stretch for 10-30 seconds. 6. Slowly release and straighten your leg. Pelvic Tilt   Repeat these steps 5-10 times: 1. Lie on your back on a firm bed or the floor with your legs extended. 2. Bend your knees so they are pointing toward the ceiling and your feet are flat on the floor. 3. Tighten your lower abdominal muscles to press your lower back against the floor. This motion will tilt your pelvis so your tailbone points up toward the ceiling instead of pointing to your feet or the floor. 4. With gentle tension and even breathing, hold this position for 5-10 seconds. Cat-Cow   Repeat these steps until your lower back becomes more flexible: 1. Get into a hands-and-knees position on a firm surface. Keep your hands under your shoulders, and keep your knees under your hips. You may place padding under your knees for  comfort. 2. Let your head hang down, and point your tailbone toward the floor so your lower back becomes rounded like the back of a cat. 3. Hold this position for 5 seconds. 4. Slowly lift your head and point your tailbone up toward the ceiling so your back forms a sagging arch like the back of a cow. 5. Hold this position for 5 seconds. Press-Ups   Repeat these steps 5-10 times: 1. Lie on your abdomen (face-down) on the floor. 2. Place your palms near your head, about shoulder-width apart. 3. While you keep your back as relaxed as possible and keep your hips on the floor, slowly straighten your arms to raise the top half of your body and lift your shoulders. Do not use your back muscles to raise your upper torso. You may adjust the placement of your hands to make yourself more comfortable. 4. Hold this position for 5 seconds while you keep your back relaxed. 5. Slowly return to lying flat on the floor. Bridges   Repeat these steps 10 times: 1. Lie on your back on a firm surface. 2. Bend your knees so they are pointing toward the ceiling and your feet are flat on the floor. 3. Tighten your buttocks muscles and lift your buttocks off of the floor until your waist is at almost the same height as your knees. You should feel the muscles  working in your buttocks and the back of your thighs. If you do not feel these muscles, slide your feet 1-2 inches farther away from your buttocks. 4. Hold this position for 3-5 seconds. 5. Slowly lower your hips to the starting position, and allow your buttocks muscles to relax completely. If this exercise is too easy, try doing it with your arms crossed over your chest. Abdominal Crunches   Repeat these steps 5-10 times: 1. Lie on your back on a firm bed or the floor with your legs extended. 2. Bend your knees so they are pointing toward the ceiling and your feet are flat on the floor. 3. Cross your arms over your chest. 4. Tip your chin slightly toward your  chest without bending your neck. 5. Tighten your abdominal muscles and slowly raise your trunk (torso) high enough to lift your shoulder blades a tiny bit off of the floor. Avoid raising your torso higher than that, because it can put too much stress on your low back and it does not help to strengthen your abdominal muscles. 6. Slowly return to your starting position. Back Lifts  Repeat these steps 5-10 times: 1. Lie on your abdomen (face-down) with your arms at your sides, and rest your forehead on the floor. 2. Tighten the muscles in your legs and your buttocks. 3. Slowly lift your chest off of the floor while you keep your hips pressed to the floor. Keep the back of your head in line with the curve in your back. Your eyes should be looking at the floor. 4. Hold this position for 3-5 seconds. 5. Slowly return to your starting position. Contact a health care provider if:  Your back pain or discomfort gets much worse when you do an exercise.  Your back pain or discomfort does not lessen within 2 hours after you exercise. If you have any of these problems, stop doing these exercises right away. Do not do them again unless your health care provider says that you can. Get help right away if:  You develop sudden, severe back pain. If this happens, stop doing the exercises right away. Do not do them again unless your health care provider says that you can. This information is not intended to replace advice given to you by your health care provider. Make sure you discuss any questions you have with your health care provider. Document Released: 12/18/2004 Document Revised: 03/19/2016 Document Reviewed: 01/04/2015 Elsevier Interactive Patient Education  2017 [employer].

## 2017-03-23 ENCOUNTER — Other Ambulatory Visit: Payer: Self-pay

## 2017-03-23 MED ORDER — LOSARTAN POTASSIUM 100 MG PO TABS: ORAL_TABLET | ORAL | 2 refills | Status: DC

## 2017-03-24 ENCOUNTER — Other Ambulatory Visit: Payer: Self-pay | Admitting: Internal Medicine

## 2017-03-24 MED ORDER — BISOPROLOL-HYDROCHLOROTHIAZIDE 5-6.25 MG PO TABS: ORAL_TABLET | ORAL | 1 refills | Status: DC

## 2017-04-13 ENCOUNTER — Telehealth: Payer: Self-pay | Admitting: Internal Medicine

## 2017-04-13 DIAGNOSIS — M545 Low back pain, unspecified: Secondary | ICD-10-CM

## 2017-04-13 NOTE — Telephone Encounter (Signed)
Patient called requesting  MRI of back, for back pain, increased over last 2wks. please advise. Recomended  to patient to activate My Chart, he agreed.

## 2017-04-14 NOTE — Addendum Note (Signed)
Addended by: Doree AlbeeOLLIER, Pearly Bartosik R on: 04/14/2017 07:39 AM   Modules accepted: Orders

## 2017-04-22 ENCOUNTER — Encounter: Payer: Self-pay | Admitting: Physician Assistant

## 2017-04-28 ENCOUNTER — Ambulatory Visit (INDEPENDENT_AMBULATORY_CARE_PROVIDER_SITE_OTHER): Payer: 59 | Admitting: Specialist

## 2017-04-28 ENCOUNTER — Encounter (INDEPENDENT_AMBULATORY_CARE_PROVIDER_SITE_OTHER): Payer: Self-pay | Admitting: Specialist

## 2017-04-28 ENCOUNTER — Ambulatory Visit (INDEPENDENT_AMBULATORY_CARE_PROVIDER_SITE_OTHER): Payer: 59

## 2017-04-28 VITALS — BP 145/88 | HR 83 | Ht 72.0 in | Wt 264.0 lb

## 2017-04-28 DIAGNOSIS — G8929 Other chronic pain: Secondary | ICD-10-CM

## 2017-04-28 DIAGNOSIS — M5416 Radiculopathy, lumbar region: Secondary | ICD-10-CM | POA: Diagnosis not present

## 2017-04-28 DIAGNOSIS — M5442 Lumbago with sciatica, left side: Secondary | ICD-10-CM

## 2017-04-28 MED ORDER — METHOCARBAMOL 500 MG PO TABS: 500.0000 mg | ORAL_TABLET | Freq: Three times a day (TID) | ORAL | 0 refills | Status: DC | PRN

## 2017-04-28 MED ORDER — METHYLPREDNISOLONE 4 MG PO TABS: ORAL_TABLET | ORAL | 0 refills | Status: DC

## 2017-05-26 ENCOUNTER — Telehealth (INDEPENDENT_AMBULATORY_CARE_PROVIDER_SITE_OTHER): Payer: Self-pay

## 2017-05-26 NOTE — Telephone Encounter (Signed)
Patient would like a Rx for pain.  Stated that he is having severe pain in his back. CB# is [phone].

## 2017-05-28 ENCOUNTER — Ambulatory Visit (INDEPENDENT_AMBULATORY_CARE_PROVIDER_SITE_OTHER): Payer: 59 | Admitting: Specialist

## 2017-05-29 ENCOUNTER — Other Ambulatory Visit (INDEPENDENT_AMBULATORY_CARE_PROVIDER_SITE_OTHER): Payer: Self-pay | Admitting: Specialist

## 2017-05-29 NOTE — Telephone Encounter (Signed)
Patient would like a Rx for pain.  Stated that he is having severe pain in his back.---Patient was scheduled for appt on 05/28/17 and he no showed that appt.  Please advise

## 2017-05-29 NOTE — Telephone Encounter (Signed)
Narcotics are controlled medications and we can not prescribe without follow up visits. jen

## 2017-06-02 NOTE — Telephone Encounter (Signed)
I called and lmom that he needs to call the office and make a follow up appt with Dr. Otelia SergeantNitka first, that he needs to be seen prior to a refill.

## 2017-06-11 ENCOUNTER — Other Ambulatory Visit: Payer: Self-pay | Admitting: Internal Medicine

## 2017-06-29 NOTE — Progress Notes (Signed)
Office Visit Note   Patient: Alan Crawford           Date of Birth: Mar 17, 1981           MRN: [id number] Visit Date: 04/28/2017              Requested by: Quentin Mulling, PA-C [id number] [place], [place] [address] PCP: Lucky Cowboy, MD   Assessment & Plan: Visit Diagnoses:  1. Chronic left-sided low back pain with left-sided sciatica   2. Radiculopathy, lumbar region     Plan: For patient's acute symptoms recommended conservative treatment with prednisone taper and muscle relaxer. Will follow up in a couple weeks for recheck. If not getting better will consider getting an MRI scan.  Follow-Up Instructions: Return in about 3 weeks (around 05/19/2017).   Orders:  Orders Placed This Encounter  Procedures  . XR Lumbar Spine 2-3 Views   Meds ordered this encounter  Medications  . methylPREDNISolone (MEDROL) 4 MG tablet    Sig: Take 4 day taper as directed    Dispense:  18 tablet    Refill:  0    Must not consume alcohol while taking this medication  . methocarbamol (ROBAXIN) 500 MG tablet    Sig: Take 1 tablet (500 mg total) by mouth every 8 (eight) hours as needed for muscle spasms.    Dispense:  20 tablet    Refill:  0    Does not consume alcohol while taking this medication      Procedures: No procedures performed   Clinical Data: No additional findings.   Subjective: Chief Complaint  Patient presents with  . Lower Back - Pain    HPI  Patient comes in today with complaints of low back pain and left leg pain. Symptoms off and on for a couple months worse last 2 weeks. Has tried conservative treatment with Aleve, absence all labs and stretching. Pain increase with sitting for extended periods. Does a lot of traveling for work. States that he is still able to run about 15 miles per week but hasn't done any weight training. Symptoms also increased with activities of daily living such as getting dressed. Has been to a chiropractor massage  therapist without any improvement. 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 States that he is going on vacation in a couple of days. Review of Systems No current cardiac pulmonary GI GU issues.  Objective: Vital Signs: BP (!) 145/88 (BP Location: Left Arm, Patient Position: Sitting)   Pulse 83   Ht 6' (1.829 m)   Wt 264 lb (119.7 kg)   BMI 35.80 kg/m   Physical Exam  Constitutional: He is oriented to person, place, and time. No distress.  HENT:  Head: Normocephalic and atraumatic.  Eyes: Pupils are equal, round, and reactive to light. EOM are normal.  Neck: Normal range of motion.  Pulmonary/Chest: No respiratory distress.  Musculoskeletal:  Gait is somewhat antalgic. Some limitation with lumbar flexion. Mild discomfort lumbar extension. Left-sided lumbar paraspinal tenderness. Mild left sciatic notch tenderness. Negative logroll. Negative straight leg raise.  Neurological: He is alert and oriented to person, place, and time.  Psychiatric: He has a normal mood and affect.    Ortho Exam  Specialty Comments:  No specialty comments available.  Imaging: No results found.   PMFS History: Patient Active Problem List   Diagnosis Date Noted  . Vitamin D deficiency 04/16/2015  . Generalized anxiety disorder 04/16/2015  . Essential hypertension 11/08/2014  . Prediabetes 11/08/2014  .  Encounter for long-term (current) use of medications 11/08/2014  . Hyperlipidemia 11/08/2014  . Obesity (BMI 30-39.9) 02/02/2014   Past Medical History:  Diagnosis Date  . Anxiety   . B12 deficiency   . Hyperlipidemia   . Hypertension   . Prediabetes   . Vitamin D deficiency     Family History  Problem Relation Age of Onset  . Hypertension Father     No past surgical history on file. Social History   Occupational History  . Not on file.   Social History Main Topics  . Smoking status: Never Smoker  . Smokeless tobacco: Never Used  . Alcohol use Not on file     Comment: social  . Drug use: No   . Sexual activity: Not on file

## 2017-07-15 ENCOUNTER — Other Ambulatory Visit: Payer: Self-pay | Admitting: Physician Assistant

## 2017-08-28 NOTE — Progress Notes (Signed)
Complete Physical  Assessment and Plan:  Essential hypertension - continue medications, DASH diet, exercise and monitor at home. Call if greater than 130/80.  -     bisoprolol-hydrochlorothiazide (ZIAC) 5-6.25 MG tablet; TAKE 1 TABLET DAILY FOR BP -     losartan (COZAAR) 100 MG tablet; TAKE 1 TABLET EVERY DAY FOR BLOOD PRESSURE -     CBC with Differential/Platelet -     BASIC METABOLIC PANEL WITH GFR -     Hepatic function panel -     TSH -     Urinalysis, Routine w reflex microscopic -     Microalbumin / creatinine urine ratio  Obesity (BMI 30-39.9) Morbid Obesity with co morbidities - long discussion about weight loss, diet, and exercise  Hyperlipidemia, unspecified hyperlipidemia type -continue medications, check lipids, decrease fatty foods, increase activity.  -     Lipid panel  Prediabetes Discussed general issues about diabetes pathophysiology and management., Educational material distributed., Suggested low cholesterol diet., Encouraged aerobic exercise., Discussed foot care., Reminded to get yearly retinal exam. -     Hemoglobin A1c  Encounter for long-term (current) use of medications -     Magnesium  Vitamin D deficiency -     VITAMIN D 25 Hydroxy (Vit-D Deficiency, Fractures)  Generalized anxiety disorder -     ALPRAZolam (XANAX) 0.5 MG tablet; Take 1 tablet (0.5 mg total) by mouth 2 (two) times daily as needed for anxiety or sleep. -     citalopram (CELEXA) 40 MG tablet; Take 1 tablet daily for Mood -     Testosterone  Encounter for general adult medical examination with abnormal findings 1 year  B12 deficiency -     Iron,Total/Total Iron Binding Cap -     Vitamin B12  Anxiety -     ALPRAZolam (XANAX) 0.5 MG tablet; Take 1 tablet (0.5 mg total) by mouth 2 (two) times daily as needed for anxiety or sleep. -     citalopram (CELEXA) 40 MG tablet; Take 1 tablet daily for Mood   Discussed med's effects and SE's. Screening labs and tests as requested with  regular follow-up as recommended. Over 40 minutes of exam, counseling, chart review and critical decision making was performed  HPI Patient presents for a complete physical.   His blood pressure has not been checked at home, today their BP is BP: 136/80 He does workout. He denies chest pain, shortness of breath, dizziness.  He is not on cholesterol medication, he is on bASA and denies myalgias. His cholesterol is not at goal. The cholesterol last visit was:   Lab Results  Component Value Date   CHOL 190 03/05/2017   HDL 44 03/05/2017   LDLCALC 127 (H) 03/05/2017   TRIG 97 03/05/2017   CHOLHDL 4.3 03/05/2017   He has been working on diet and exercise for prediabetes,  and denies paresthesia of the feet, polydipsia, polyuria and visual disturbances. Last A1C in the office was:  Lab Results  Component Value Date   HGBA1C 5.5 08/25/2016   Patient is on Vitamin D supplement.   Lab Results  Component Value Date   VD25OH 41 08/25/2016     BMI is Body mass index is 35.24 kg/m., he is working on diet and exercise. 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  B12 last year showed B12 of 302, has been taking MVIT with B12 in it.    Current Medications:  Current Outpatient Prescriptions on File Prior to Visit  Medication Sig Dispense Refill  . ALPRAZolam (XANAX) 0.5 MG tablet Take 1 tablet (0.5 mg total) by mouth 2 (two) times daily as needed for anxiety or sleep. 60 tablet 0  . bisoprolol-hydrochlorothiazide (ZIAC) 5-6.25 MG tablet TAKE 1 TABLET DAILY FOR BP 90 tablet 0  . citalopram (CELEXA) 40 MG tablet Take 1 tablet daily for Mood 90 tablet 1  . losartan (COZAAR) 100 MG tablet TAKE 1 TABLET EVERY DAY FOR BLOOD PRESSURE 30 tablet 1   No current facility-administered medications on file prior to  visit.    Health Maintenance:  Tetanus: 7 years ago  Declines STD testing  Patient Care Team: Lucky Cowboy, MD as PCP - General (Internal Medicine)  Medical History:  Past Medical History:  Diagnosis Date  . Anxiety   . B12 deficiency   . Hyperlipidemia   . Hypertension   . Prediabetes   . Vitamin D deficiency    Allergies No Known Allergies  SURGICAL HISTORY He  has no past surgical history on file. FAMILY HISTORY His family history includes Hypertension in his father. SOCIAL HISTORY He  reports that he has never smoked. He has never used smokeless tobacco. He reports that he does not use drugs.   Review of Systems:  Review of Systems  Constitutional: Negative for chills, diaphoresis, fever, malaise/fatigue and weight loss.  HENT: Negative for congestion, ear discharge, ear pain, hearing loss, nosebleeds, sore throat and tinnitus.   Eyes: Negative.   Respiratory: Negative.  Negative for stridor.   Cardiovascular: Negative for chest pain, palpitations, orthopnea, claudication, leg swelling and PND.  Gastrointestinal: Negative.   Genitourinary: Negative.   Musculoskeletal: Negative for back pain, falls, joint pain, myalgias and neck pain.  Skin: Negative.   Neurological: Negative for weakness and headaches.  Endo/Heme/Allergies: Negative.   Psychiatric/Behavioral: Negative for depression, hallucinations, memory loss, substance abuse and suicidal ideas. The patient is not nervous/anxious and does not have insomnia.     Physical Exam: Estimated body mass index is 35.24 kg/m as calculated from the following:   Height as of this encounter: 6' (1.829 m).   Weight as of this encounter: 259 lb 12.8 oz (117.8 kg). BP 136/80   Pulse 81   Temp 97.7 F (36.5 C)   Resp 18   Ht 6' (1.829 m)   Wt 259 lb 12.8 oz (117.8 kg)   SpO2 95%   BMI 35.24 kg/m  General Appearance: Well nourished, in no apparent distress.  Eyes: PERRLA, EOMs, conjunctiva no swelling or  erythema, normal fundi and vessels.  Sinuses: No Frontal/maxillary tenderness  ENT/Mouth: Ext aud canals clear, normal light reflex with TMs without erythema, bulging. Good dentition. No erythema, swelling, or exudate on post pharynx. Tonsils not swollen or erythematous. Hearing normal. Nose deviated to the left with septum deviation.  Neck: Supple, thyroid normal. No bruits  Respiratory: Respiratory effort normal, BS equal bilaterally without rales, rhonchi, wheezing or stridor.  Cardio: RRR without murmurs, rubs or gallops. Brisk peripheral pulses without edema.  Chest: symmetric, with normal excursions and percussion.  Abdomen: Soft, nontender, no guarding, rebound, hernias, masses, or organomegaly.  Lymphatics: Non tender without lymphadenopathy.  Genitourinary:  Musculoskeletal: Full ROM all peripheral extremities,5/5 strength, and normal gait.  Skin: Warm, dry without rashes, lesions, ecchymosis. Neuro: Cranial nerves intact, reflexes equal  bilaterally. Normal muscle tone, no cerebellar symptoms. Sensation intact.  Psych: Awake and oriented X 3, normal affect, Insight and Judgment appropriate.   EKG: declines  Quentin Mulling 10:44 AM [facility]

## 2017-08-31 ENCOUNTER — Encounter: Payer: Self-pay | Admitting: Physician Assistant

## 2017-08-31 ENCOUNTER — Ambulatory Visit (INDEPENDENT_AMBULATORY_CARE_PROVIDER_SITE_OTHER): Payer: 59 | Admitting: Physician Assistant

## 2017-08-31 VITALS — BP 136/80 | HR 81 | Temp 97.7°F | Resp 18 | Ht 72.0 in | Wt 259.8 lb

## 2017-08-31 DIAGNOSIS — Z79899 Other long term (current) drug therapy: Secondary | ICD-10-CM

## 2017-08-31 DIAGNOSIS — E785 Hyperlipidemia, unspecified: Secondary | ICD-10-CM

## 2017-08-31 DIAGNOSIS — Z0000 Encounter for general adult medical examination without abnormal findings: Secondary | ICD-10-CM

## 2017-08-31 DIAGNOSIS — F419 Anxiety disorder, unspecified: Secondary | ICD-10-CM

## 2017-08-31 DIAGNOSIS — R7303 Prediabetes: Secondary | ICD-10-CM

## 2017-08-31 DIAGNOSIS — I10 Essential (primary) hypertension: Secondary | ICD-10-CM

## 2017-08-31 DIAGNOSIS — Z0001 Encounter for general adult medical examination with abnormal findings: Secondary | ICD-10-CM

## 2017-08-31 DIAGNOSIS — E559 Vitamin D deficiency, unspecified: Secondary | ICD-10-CM

## 2017-08-31 DIAGNOSIS — E538 Deficiency of other specified B group vitamins: Secondary | ICD-10-CM

## 2017-08-31 DIAGNOSIS — E669 Obesity, unspecified: Secondary | ICD-10-CM

## 2017-08-31 DIAGNOSIS — F411 Generalized anxiety disorder: Secondary | ICD-10-CM

## 2017-08-31 MED ORDER — LOSARTAN POTASSIUM 100 MG PO TABS: ORAL_TABLET | ORAL | 3 refills | Status: DC

## 2017-08-31 MED ORDER — CITALOPRAM HYDROBROMIDE 40 MG PO TABS: ORAL_TABLET | ORAL | 3 refills | Status: DC

## 2017-08-31 MED ORDER — ALPRAZOLAM 0.5 MG PO TABS: 0.5000 mg | ORAL_TABLET | Freq: Two times a day (BID) | ORAL | 0 refills | Status: AC | PRN

## 2017-08-31 MED ORDER — BISOPROLOL-HYDROCHLOROTHIAZIDE 5-6.25 MG PO TABS: ORAL_TABLET | ORAL | 3 refills | Status: DC

## 2017-08-31 NOTE — Patient Instructions (Signed)
With weight loss can start cutting back on ziac  Monitor your blood pressure at home. Go to the ER if any CP, SOB, nausea, dizziness, severe HA, changes vision/speech  Goal BP:  For patients younger than 60: Goal BP < 140/90. For patients 60 and older: Goal BP < 150/90. For patients with diabetes: Goal BP < 140/90. Your most recent BP: BP: 136/80   Take your medications faithfully as instructed. Maintain a healthy weight. Get at least 150 minutes of aerobic exercise per week. Minimize salt intake. Minimize alcohol intake  DASH Eating Plan DASH stands for "Dietary Approaches to Stop Hypertension." The DASH eating plan is a healthy eating plan that has been shown to reduce high blood pressure (hypertension). Additional health benefits may include reducing the risk of type 2 diabetes mellitus, heart disease, and stroke. The DASH eating plan may also help with weight loss. WHAT DO I NEED TO KNOW ABOUT THE DASH EATING PLAN? For the DASH eating plan, you will follow these general guidelines:  Choose foods with a percent daily value for sodium of less than 5% (as listed on the food label).  Use salt-free seasonings or herbs instead of table salt or sea salt.  Check with your health care provider or pharmacist before using salt substitutes.  Eat lower-sodium products, often labeled as "lower sodium" or "no salt added."  Eat fresh foods.  Eat more vegetables, fruits, and low-fat dairy products.  Choose whole grains. Look for the word "whole" as the first word in the ingredient list.  Choose fish and skinless chicken or [place] more often than red meat. Limit fish, poultry, and meat to 6 oz (170 g) each day.  Limit sweets, desserts, sugars, and sugary drinks.  Choose heart-healthy fats.  Limit cheese to 1 oz (28 g) per day.  Eat more home-cooked food and less restaurant, buffet, and fast food.  Limit fried foods.  Cook foods using methods other than frying.  Limit canned  vegetables. If you do use them, rinse them well to decrease the sodium.  When eating at a restaurant, ask that your food be prepared with less salt, or no salt if possible. WHAT FOODS CAN I EAT? Seek help from a dietitian for individual calorie needs. Grains Whole grain or whole wheat bread. Brown rice. Whole grain or whole wheat pasta. Quinoa, bulgur, and whole grain cereals. Low-sodium cereals. Corn or whole wheat flour tortillas. Whole grain cornbread. Whole grain crackers. Low-sodium crackers. Vegetables Fresh or frozen vegetables (raw, steamed, roasted, or grilled). Low-sodium or reduced-sodium tomato and vegetable juices. Low-sodium or reduced-sodium tomato sauce and paste. Low-sodium or reduced-sodium canned vegetables.  Fruits All fresh, canned (in natural juice), or frozen fruits. Meat and Other Protein Products Ground beef (85% or leaner), grass-fed beef, or beef trimmed of fat. Skinless chicken or [place]. Ground chicken or [place]. Pork trimmed of fat. All fish and seafood. Eggs. Dried beans, peas, or lentils. Unsalted nuts and seeds. Unsalted canned beans. Dairy Low-fat dairy products, such as skim or 1% milk, 2% or reduced-fat cheeses, low-fat ricotta or cottage cheese, or plain low-fat yogurt. Low-sodium or reduced-sodium cheeses. Fats and Oils Tub margarines without trans fats. Light or reduced-fat mayonnaise and salad dressings (reduced sodium). Avocado. Safflower, olive, or canola oils. Natural peanut or almond butter. Other Unsalted popcorn and pretzels. The items listed above may not be a complete list of recommended foods or beverages. Contact your dietitian for more options. WHAT FOODS ARE NOT RECOMMENDED? Grains White bread. White pasta. White  rice. Refined cornbread. Bagels and croissants. Crackers that contain trans fat. Vegetables Creamed or fried vegetables. Vegetables in a cheese sauce. Regular canned vegetables. Regular canned tomato sauce and paste. Regular tomato  and vegetable juices. Fruits Dried fruits. Canned fruit in light or heavy syrup. Fruit juice. Meat and Other Protein Products Fatty cuts of meat. Ribs, chicken wings, bacon, sausage, bologna, salami, chitterlings, fatback, hot dogs, bratwurst, and packaged luncheon meats. Salted nuts and seeds. Canned beans with salt. Dairy Whole or 2% milk, cream, half-and-half, and cream cheese. Whole-fat or sweetened yogurt. Full-fat cheeses or blue cheese. Nondairy creamers and whipped toppings. Processed cheese, cheese spreads, or cheese curds. Condiments Onion and garlic salt, seasoned salt, table salt, and sea salt. Canned and packaged gravies. Worcestershire sauce. Tartar sauce. Barbecue sauce. Teriyaki sauce. Soy sauce, including reduced sodium. Steak sauce. Fish sauce. Oyster sauce. Cocktail sauce. Horseradish. Ketchup and mustard. Meat flavorings and tenderizers. Bouillon cubes. Hot sauce. Tabasco sauce. Marinades. Taco seasonings. Relishes. Fats and Oils Butter, stick margarine, lard, shortening, ghee, and bacon fat. Coconut, palm kernel, or palm oils. Regular salad dressings. Other Pickles and olives. Salted popcorn and pretzels. The items listed above may not be a complete list of foods and beverages to avoid. Contact your dietitian for more information. WHERE CAN I FIND MORE INFORMATION? National [employer], [employer], [employer] Institute: [employer] Document Released: 10/30/2011 Document Revised: 03/27/2014 Document Reviewed: 09/14/2013 [facility] Patient Information 2015 [place], [place]. This information is not intended to replace advice given to you by your health care provider. Make sure you discuss any questions you have with your health care provider.  We want weight loss that will last so you should lose 1-2 pounds a week.  THAT IS IT! Please pick THREE things a month to change. Once it is a habit check off the item. Then pick another three items off the list to  become habits.  If you are already doing a habit on the list GREAT!  Cross that item off! o Don't drink your calories. Ie, alcohol, soda, fruit juice, and sweet tea.  o Drink more water. Drink a glass when you feel hungry or before each meal.  o Eat breakfast - Complex carb and protein (likeDannon light and fit yogurt, oatmeal, fruit, eggs, [place] bacon). o Measure your cereal.  Eat no more than one cup a day. (ie [place]) o Eat an apple a day. o Add a vegetable a day. o Try a new vegetable a month. o Use Pam! Stop using oil or butter to cook. o Don't finish your plate or use smaller plates. o Share your dessert. o Eat sugar free Jello for dessert or frozen grapes. o Don't eat 2-3 hours before bed. o Switch to whole wheat bread, pasta, and brown rice. o Make healthier choices when you eat out. No fries! o Pick baked chicken, NOT fried. o Don't forget to SLOW DOWN when you eat. It is not going anywhere.  o Take the stairs. o Park far away in the parking lot o [employer] (or weights) for 10 minutes while watching TV. o Walk at work for 10 minutes during break. o Walk outside 1 time a week with your friend, kids, dog, or significant other. o Start a walking group at church. o Walk the mall as much as you can tolerate.  o Keep a food diary. o Weigh yourself daily. o Walk for 15 minutes 3 days per week. o Cook at home more often and eat out less.  If  life happens and you go back to old habits, it is okay.  Just start over. You can do it!   If you experience chest pain, get short of breath, or tired during the exercise, please stop immediately and inform your doctor.

## 2017-09-01 LAB — CBC WITH DIFFERENTIAL/PLATELET
BASOS PCT: 0.2 %
Basophils Absolute: 9 cells/uL (ref 0–200)
EOS ABS: 60 {cells}/uL (ref 15–500)
Eosinophils Relative: 1.4 %
HEMATOCRIT: 43.3 % (ref 38.5–50.0)
Hemoglobin: 15 g/dL (ref 13.2–17.1)
LYMPHS ABS: 1367 {cells}/uL (ref 850–3900)
MCH: 30.7 pg (ref 27.0–33.0)
MCHC: 34.6 g/dL (ref 32.0–36.0)
MCV: 88.5 fL (ref 80.0–100.0)
MPV: 11.4 fL (ref 7.5–12.5)
Monocytes Relative: 12.5 %
NEUTROS PCT: 54.1 %
Neutro Abs: 2326 cells/uL (ref 1500–7800)
Platelets: 236 10*3/uL (ref 140–400)
RBC: 4.89 10*6/uL (ref 4.20–5.80)
RDW: 12.5 % (ref 11.0–15.0)
TOTAL LYMPHOCYTE: 31.8 %
WBC: 4.3 10*3/uL (ref 3.8–10.8)
WBCMIX: 538 {cells}/uL (ref 200–950)

## 2017-09-01 LAB — HEPATIC FUNCTION PANEL
AG RATIO: 1.6 (calc) (ref 1.0–2.5)
ALKALINE PHOSPHATASE (APISO): 65 U/L (ref 40–115)
ALT: 36 U/L (ref 9–46)
AST: 29 U/L (ref 10–40)
Albumin: 4.4 g/dL (ref 3.6–5.1)
BILIRUBIN DIRECT: 0.2 mg/dL (ref 0.0–0.2)
BILIRUBIN INDIRECT: 0.7 mg/dL (ref 0.2–1.2)
Globulin: 2.7 g/dL (calc) (ref 1.9–3.7)
TOTAL PROTEIN: 7.1 g/dL (ref 6.1–8.1)
Total Bilirubin: 0.9 mg/dL (ref 0.2–1.2)

## 2017-09-01 LAB — TSH: TSH: 0.72 mIU/L (ref 0.40–4.50)

## 2017-09-01 LAB — BASIC METABOLIC PANEL WITH GFR
BUN: 13 mg/dL (ref 7–25)
CALCIUM: 9.4 mg/dL (ref 8.6–10.3)
CHLORIDE: 102 mmol/L (ref 98–110)
CO2: 29 mmol/L (ref 20–32)
CREATININE: 1.12 mg/dL (ref 0.60–1.35)
GFR, Est African American: 97 mL/min/{1.73_m2} (ref >=60)
GFR, Est Non African American: 84 mL/min/{1.73_m2} (ref >=60)
Glucose, Bld: 94 mg/dL (ref 65–99)
Potassium: 4.5 mmol/L (ref 3.5–5.3)
SODIUM: 138 mmol/L (ref 135–146)

## 2017-09-01 LAB — TESTOSTERONE: Testosterone: 513 ng/dL (ref 250–827)

## 2017-09-01 LAB — VITAMIN D 25 HYDROXY (VIT D DEFICIENCY, FRACTURES): VIT D 25 HYDROXY: 38 ng/mL (ref 30–100)

## 2017-09-01 LAB — LIPID PANEL
Cholesterol: 222 mg/dL — ABNORMAL HIGH (ref <200)
HDL: 54 mg/dL (ref >40)
LDL CHOLESTEROL (CALC): 147 mg/dL — AB
NON-HDL CHOLESTEROL (CALC): 168 mg/dL — AB (ref <130)
TRIGLYCERIDES: 101 mg/dL (ref <150)
Total CHOL/HDL Ratio: 4.1 (calc) (ref <5.0)

## 2017-09-01 LAB — HEMOGLOBIN A1C
Hgb A1c MFr Bld: 5.6 % of total Hgb (ref <5.7)
Mean Plasma Glucose: 114 (calc)
eAG (mmol/L): 6.3 (calc)

## 2017-09-01 LAB — IRON, TOTAL/TOTAL IRON BINDING CAP
%SAT: 40 % (calc) (ref 15–60)
Iron: 140 ug/dL (ref 50–180)
TIBC: 351 mcg/dL (calc) (ref 250–425)

## 2017-09-01 LAB — MAGNESIUM: MAGNESIUM: 2 mg/dL (ref 1.5–2.5)

## 2017-09-01 LAB — VITAMIN B12: Vitamin B-12: 447 pg/mL (ref 200–1100)

## 2017-12-04 ENCOUNTER — Ambulatory Visit: Payer: Self-pay | Admitting: Physician Assistant

## 2018-09-08 ENCOUNTER — Encounter: Payer: Self-pay | Admitting: Physician Assistant

## 2018-10-04 ENCOUNTER — Other Ambulatory Visit: Payer: Self-pay | Admitting: Physician Assistant

## 2018-10-04 DIAGNOSIS — F419 Anxiety disorder, unspecified: Secondary | ICD-10-CM

## 2018-10-04 DIAGNOSIS — F411 Generalized anxiety disorder: Secondary | ICD-10-CM

## 2018-10-08 ENCOUNTER — Other Ambulatory Visit: Payer: Self-pay | Admitting: Internal Medicine

## 2018-10-08 ENCOUNTER — Other Ambulatory Visit: Payer: Self-pay | Admitting: Physician Assistant

## 2018-10-08 DIAGNOSIS — F411 Generalized anxiety disorder: Secondary | ICD-10-CM

## 2018-10-08 DIAGNOSIS — F419 Anxiety disorder, unspecified: Secondary | ICD-10-CM

## 2018-10-08 MED ORDER — CITALOPRAM HYDROBROMIDE 40 MG PO TABS: ORAL_TABLET | ORAL | 0 refills | Status: DC

## 2018-10-24 ENCOUNTER — Other Ambulatory Visit: Payer: Self-pay | Admitting: Internal Medicine

## 2018-10-24 DIAGNOSIS — F411 Generalized anxiety disorder: Secondary | ICD-10-CM

## 2018-10-24 DIAGNOSIS — F419 Anxiety disorder, unspecified: Secondary | ICD-10-CM

## 2018-11-05 ENCOUNTER — Telehealth: Payer: Self-pay | Admitting: Physician Assistant

## 2018-11-05 DIAGNOSIS — F411 Generalized anxiety disorder: Secondary | ICD-10-CM

## 2018-11-05 DIAGNOSIS — F419 Anxiety disorder, unspecified: Secondary | ICD-10-CM

## 2018-11-05 DIAGNOSIS — I10 Essential (primary) hypertension: Secondary | ICD-10-CM

## 2018-11-05 MED ORDER — CITALOPRAM HYDROBROMIDE 40 MG PO TABS: ORAL_TABLET | ORAL | 0 refills | Status: DC

## 2018-11-05 MED ORDER — LOSARTAN POTASSIUM 100 MG PO TABS: ORAL_TABLET | ORAL | 0 refills | Status: AC

## 2018-11-05 MED ORDER — BISOPROLOL-HYDROCHLOROTHIAZIDE 5-6.25 MG PO TABS: ORAL_TABLET | ORAL | 0 refills | Status: AC

## 2018-11-05 NOTE — Telephone Encounter (Signed)
At the time of the call the [employer] recording stated that the pt's phone had calling restriction. Dec 13th 2019 at 9:17am DD

## 2018-11-05 NOTE — Telephone Encounter (Signed)
-----   Message from Gregery NaAngela D Duff, CMA sent at 11/04/2018  2:03 PM EST ----- Regarding: med refill Per yellow note   Still has not found a Dr. Eulah CitizenWould like to know if he can get his meds refilled until then, states he can not go w/o meds  Pharmacy-[facility] ([address])

## 2019-01-31 ENCOUNTER — Other Ambulatory Visit: Payer: Self-pay | Admitting: Physician Assistant

## 2019-01-31 DIAGNOSIS — F419 Anxiety disorder, unspecified: Secondary | ICD-10-CM

## 2019-01-31 DIAGNOSIS — F411 Generalized anxiety disorder: Secondary | ICD-10-CM
